# Patient Record
Sex: Male | Born: 1955 | Race: White | Hispanic: No | Marital: Married | Smoking: Never smoker
Health system: Southern US, Community
[De-identification: ages and names within clinical notes are randomized; demographics above are authoritative.]

## PROBLEM LIST (undated history)

## (undated) DIAGNOSIS — I10 Essential (primary) hypertension: Secondary | ICD-10-CM

## (undated) HISTORY — PX: SHOULDER SURGERY: SHX246

---

## 1998-06-24 ENCOUNTER — Emergency Department (HOSPITAL_COMMUNITY): Admission: EM | Admit: 1998-06-24 | Discharge: 1998-06-24 | Payer: Self-pay | Admitting: Emergency Medicine

## 2004-01-28 ENCOUNTER — Ambulatory Visit (HOSPITAL_COMMUNITY): Admission: RE | Admit: 2004-01-28 | Discharge: 2004-01-28 | Payer: Self-pay | Admitting: *Deleted

## 2012-02-29 ENCOUNTER — Other Ambulatory Visit: Payer: Self-pay | Admitting: Family Medicine

## 2012-02-29 DIAGNOSIS — B1710 Acute hepatitis C without hepatic coma: Secondary | ICD-10-CM

## 2012-03-14 ENCOUNTER — Ambulatory Visit
Admission: RE | Admit: 2012-03-14 | Discharge: 2012-03-14 | Disposition: A | Discharge status: Home / Self Care | Payer: BC Managed Care – PPO | Source: Ambulatory Visit | Attending: Family Medicine | Admitting: Family Medicine

## 2012-03-14 DIAGNOSIS — B1710 Acute hepatitis C without hepatic coma: Secondary | ICD-10-CM

## 2013-01-22 ENCOUNTER — Ambulatory Visit
Admission: RE | Admit: 2013-01-22 | Discharge: 2013-01-22 | Disposition: A | Discharge status: Home / Self Care | Payer: BC Managed Care – PPO | Source: Ambulatory Visit | Attending: Physician Assistant | Admitting: Physician Assistant

## 2013-01-22 ENCOUNTER — Other Ambulatory Visit: Payer: Self-pay | Admitting: Physician Assistant

## 2013-01-22 DIAGNOSIS — M25511 Pain in right shoulder: Secondary | ICD-10-CM

## 2013-01-22 DIAGNOSIS — IMO0001 Reserved for inherently not codable concepts without codable children: Secondary | ICD-10-CM

## 2013-02-03 ENCOUNTER — Other Ambulatory Visit: Payer: Self-pay | Admitting: Orthopedic Surgery

## 2013-02-03 DIAGNOSIS — M25511 Pain in right shoulder: Secondary | ICD-10-CM

## 2013-02-06 ENCOUNTER — Other Ambulatory Visit: Payer: BC Managed Care – PPO

## 2013-02-09 ENCOUNTER — Ambulatory Visit
Admission: RE | Admit: 2013-02-09 | Discharge: 2013-02-09 | Disposition: A | Discharge status: Home / Self Care | Payer: BC Managed Care – PPO | Source: Ambulatory Visit | Attending: Orthopedic Surgery | Admitting: Orthopedic Surgery

## 2013-02-09 DIAGNOSIS — M25511 Pain in right shoulder: Secondary | ICD-10-CM

## 2014-11-04 ENCOUNTER — Inpatient Hospital Stay (HOSPITAL_COMMUNITY)
Admission: EM | Admit: 2014-11-04 | Discharge: 2014-11-07 | DRG: 440 | Disposition: A | Discharge status: Home / Self Care | Payer: BC Managed Care – PPO | Attending: Internal Medicine | Admitting: Internal Medicine

## 2014-11-04 ENCOUNTER — Emergency Department (HOSPITAL_COMMUNITY): Payer: BC Managed Care – PPO

## 2014-11-04 ENCOUNTER — Encounter (HOSPITAL_COMMUNITY): Payer: Self-pay | Admitting: Emergency Medicine

## 2014-11-04 DIAGNOSIS — Z79899 Other long term (current) drug therapy: Secondary | ICD-10-CM | POA: Diagnosis not present

## 2014-11-04 DIAGNOSIS — K859 Acute pancreatitis without necrosis or infection, unspecified: Secondary | ICD-10-CM | POA: Diagnosis present

## 2014-11-04 DIAGNOSIS — F419 Anxiety disorder, unspecified: Secondary | ICD-10-CM | POA: Diagnosis present

## 2014-11-04 DIAGNOSIS — B1920 Unspecified viral hepatitis C without hepatic coma: Secondary | ICD-10-CM | POA: Diagnosis present

## 2014-11-04 DIAGNOSIS — R1013 Epigastric pain: Secondary | ICD-10-CM | POA: Diagnosis present

## 2014-11-04 DIAGNOSIS — Z791 Long term (current) use of non-steroidal anti-inflammatories (NSAID): Secondary | ICD-10-CM | POA: Diagnosis not present

## 2014-11-04 DIAGNOSIS — I10 Essential (primary) hypertension: Secondary | ICD-10-CM | POA: Diagnosis present

## 2014-11-04 DIAGNOSIS — R000 Tachycardia, unspecified: Secondary | ICD-10-CM | POA: Diagnosis present

## 2014-11-04 DIAGNOSIS — D72829 Elevated white blood cell count, unspecified: Secondary | ICD-10-CM | POA: Diagnosis present

## 2014-11-04 DIAGNOSIS — R509 Fever, unspecified: Secondary | ICD-10-CM | POA: Diagnosis not present

## 2014-11-04 DIAGNOSIS — Z8249 Family history of ischemic heart disease and other diseases of the circulatory system: Secondary | ICD-10-CM

## 2014-11-04 DIAGNOSIS — F1210 Cannabis abuse, uncomplicated: Secondary | ICD-10-CM | POA: Diagnosis present

## 2014-11-04 DIAGNOSIS — K8500 Idiopathic acute pancreatitis without necrosis or infection: Secondary | ICD-10-CM

## 2014-11-04 HISTORY — DX: Essential (primary) hypertension: I10

## 2014-11-04 LAB — COMPREHENSIVE METABOLIC PANEL
ALBUMIN: 3.6 g/dL (ref 3.5–5.2)
ALK PHOS: 79 U/L (ref 39–117)
ALT: 48 U/L (ref 0–53)
ANION GAP: 13 (ref 5–15)
AST: 33 U/L (ref 0–37)
BUN: 17 mg/dL (ref 6–23)
CO2: 22 mEq/L (ref 19–32)
Calcium: 8.8 mg/dL (ref 8.4–10.5)
Chloride: 101 mEq/L (ref 96–112)
Creatinine, Ser: 0.92 mg/dL (ref 0.50–1.35)
GFR calc Af Amer: >90 mL/min (ref >90)
GFR calc non Af Amer: >90 mL/min (ref >90)
Glucose, Bld: 133 mg/dL — ABNORMAL HIGH (ref 70–99)
Potassium: 3.8 mEq/L (ref 3.7–5.3)
Sodium: 136 mEq/L — ABNORMAL LOW (ref 137–147)
TOTAL PROTEIN: 7 g/dL (ref 6.0–8.3)
Total Bilirubin: 1 mg/dL (ref 0.3–1.2)

## 2014-11-04 LAB — CBC WITH DIFFERENTIAL/PLATELET
Basophils Absolute: 0 10*3/uL (ref 0.0–0.1)
Basophils Relative: 0 % (ref 0–1)
Eosinophils Absolute: 0 10*3/uL (ref 0.0–0.7)
Eosinophils Relative: 0 % (ref 0–5)
HCT: 44.9 % (ref 39.0–52.0)
Hemoglobin: 15.4 g/dL (ref 13.0–17.0)
LYMPHS ABS: 1.8 10*3/uL (ref 0.7–4.0)
LYMPHS PCT: 11 % — AB (ref 12–46)
MCH: 31.2 pg (ref 26.0–34.0)
MCHC: 34.3 g/dL (ref 30.0–36.0)
MCV: 90.9 fL (ref 78.0–100.0)
MONOS PCT: 10 % (ref 3–12)
Monocytes Absolute: 1.6 10*3/uL — ABNORMAL HIGH (ref 0.1–1.0)
Neutro Abs: 13 10*3/uL — ABNORMAL HIGH (ref 1.7–7.7)
Neutrophils Relative %: 79 % — ABNORMAL HIGH (ref 43–77)
Platelets: 164 10*3/uL (ref 150–400)
RBC: 4.94 MIL/uL (ref 4.22–5.81)
RDW: 13.3 % (ref 11.5–15.5)
WBC: 16.5 10*3/uL — AB (ref 4.0–10.5)

## 2014-11-04 LAB — ETHANOL: Alcohol, Ethyl (B): <11 mg/dL (ref 0–11)

## 2014-11-04 LAB — TRIGLYCERIDES: Triglycerides: 54 mg/dL (ref <150)

## 2014-11-04 LAB — RAPID URINE DRUG SCREEN, HOSP PERFORMED
AMPHETAMINES: NOT DETECTED
BARBITURATES: NOT DETECTED
BENZODIAZEPINES: NOT DETECTED
Cocaine: NOT DETECTED
Opiates: POSITIVE — AB
TETRAHYDROCANNABINOL: POSITIVE — AB

## 2014-11-04 LAB — TROPONIN I: Troponin I: <0.3 ng/mL (ref <0.30)

## 2014-11-04 LAB — LIPASE, BLOOD: Lipase: 509 U/L — ABNORMAL HIGH (ref 11–59)

## 2014-11-04 MED ORDER — ONDANSETRON HCL 4 MG PO TABS: 4.0000 mg | ORAL_TABLET | Freq: Four times a day (QID) | ORAL | Status: DC | PRN

## 2014-11-04 MED ORDER — LORAZEPAM 2 MG/ML IJ SOLN
0.5000 mg | Freq: Four times a day (QID) | INTRAMUSCULAR | Status: DC | PRN
  Administered 2014-11-04 – 2014-11-07 (×6): 0.5 mg via INTRAVENOUS
  Filled 2014-11-04 (×6): qty 1

## 2014-11-04 MED ORDER — CYCLOBENZAPRINE HCL 10 MG PO TABS
5.0000 mg | ORAL_TABLET | Freq: Two times a day (BID) | ORAL | Status: DC | PRN
  Administered 2014-11-05 (×2): 5 mg via ORAL
  Filled 2014-11-04 (×2): qty 1

## 2014-11-04 MED ORDER — ACETAMINOPHEN 325 MG PO TABS: 650.0000 mg | ORAL_TABLET | Freq: Four times a day (QID) | ORAL | Status: DC | PRN

## 2014-11-04 MED ORDER — ONDANSETRON HCL 4 MG/2ML IJ SOLN
4.0000 mg | Freq: Once | INTRAMUSCULAR | Status: AC
  Administered 2014-11-04: 4 mg via INTRAVENOUS
  Filled 2014-11-04: qty 2

## 2014-11-04 MED ORDER — BENAZEPRIL HCL 20 MG PO TABS
20.0000 mg | ORAL_TABLET | Freq: Every day | ORAL | Status: DC
  Administered 2014-11-05 – 2014-11-07 (×3): 20 mg via ORAL
  Filled 2014-11-04 (×4): qty 1

## 2014-11-04 MED ORDER — SODIUM CHLORIDE 0.9 % IV SOLN
INTRAVENOUS | Status: DC
  Administered 2014-11-04 – 2014-11-05 (×2): via INTRAVENOUS

## 2014-11-04 MED ORDER — ACETAMINOPHEN 650 MG RE SUPP: 650.0000 mg | Freq: Four times a day (QID) | RECTAL | Status: DC | PRN

## 2014-11-04 MED ORDER — ONDANSETRON HCL 4 MG/2ML IJ SOLN: 4.0000 mg | Freq: Four times a day (QID) | INTRAMUSCULAR | Status: DC | PRN

## 2014-11-04 MED ORDER — MORPHINE SULFATE 2 MG/ML IJ SOLN
1.0000 mg | INTRAMUSCULAR | Status: DC | PRN
  Administered 2014-11-04 – 2014-11-05 (×7): 1 mg via INTRAVENOUS
  Filled 2014-11-04 (×7): qty 1

## 2014-11-04 MED ORDER — SODIUM CHLORIDE 0.9 % IV BOLUS (SEPSIS)
1000.0000 mL | Freq: Once | INTRAVENOUS | Status: AC
  Administered 2014-11-04: 1000 mL via INTRAVENOUS

## 2014-11-04 MED ORDER — HYDROCODONE-ACETAMINOPHEN 5-325 MG PO TABS
1.0000 | ORAL_TABLET | Freq: Once | ORAL | Status: AC
  Administered 2014-11-04: 1 via ORAL
  Filled 2014-11-04: qty 1

## 2014-11-04 MED ORDER — AMLODIPINE BESYLATE 10 MG PO TABS
10.0000 mg | ORAL_TABLET | Freq: Every day | ORAL | Status: DC
  Administered 2014-11-05 – 2014-11-07 (×3): 10 mg via ORAL
  Filled 2014-11-04 (×4): qty 1

## 2014-11-04 MED ORDER — AMLODIPINE BESY-BENAZEPRIL HCL 10-20 MG PO CAPS: 1.0000 | ORAL_CAPSULE | Freq: Every day | ORAL | Status: DC

## 2014-11-04 MED ORDER — ADULT MULTIVITAMIN W/MINERALS CH
1.0000 | ORAL_TABLET | Freq: Every day | ORAL | Status: DC
  Administered 2014-11-05: 1 via ORAL
  Filled 2014-11-04 (×3): qty 1

## 2014-11-04 NOTE — ED Notes (Signed)
Pt reports constant abdominal pain for x 3 days with nausea and vomiting denies diarrhea. Last BM last Pm. Feels weak. Pt ambulatory. NAD

## 2014-11-04 NOTE — Progress Notes (Signed)
Utilization Review completed.  Alaija Ruble RN CM  

## 2014-11-04 NOTE — ED Notes (Signed)
Patient transported to CT 

## 2014-11-04 NOTE — H&P (Signed)
[facility] History and Physical  Almyra FreeMalcolm W Samara RUE:[id number]:[id number] DOB: [birth year] DOA: 11/04/2014  Referring physician: ER physician PCP: No primary care provider on file.   Chief Complaint: abdominal pain  HPI:  58 -year-old male with past medical history significant for hepatitis C but not on treatment, hypertension, anxiety who [facility]04/2014 with worsening abdominal pain in epigastric and mid abdominal area for past 2 days prior to this admission. Patient reported pain to be 7 out of 10 in intensity, radiating to the back, constant, not relieved with home analgesia. Patient reported pain got better in ED.Patient reported associated nausea and about 10 episodes of nonbloody vomiting. No reports of diarrhea or constipation. No reports of fever. No reports of blood in the stool. No other complaints such as chest pain, shortness of breath or palpitations. No lightheadedness or loss of consciousness. In ED, patient was hemodynamically stable. Blood work was significant for lipase level of 509 in addition to evidence of pancreatitis on CT abdomen pelvis without contrast. Patient was admitted for further management of acute pancreatitis.  Assessment & Plan    Principal Problem:   Acute pancreatitis - abdominal pain likely secondary to acute pancreatitis as evidenced on CT abdomen and pelvis without contrast. In addition, lipase level is elevated at 509. - Liver function enzymes are all within normal limits. - Obtain alcohol and urine drug screen - Continue supportive care with IV fluids, analgesia as needed, antiemetics as needed - Keep nothing by mouth for now. Active Problems:   Leukocytosis - likely reflective of acute pancreatitis, inflammatory. No evidence of acute infection.   HTN (hypertension) - resume home medication Lotrel - renal function is within normal limits   Anxiety - since patient is nothing by mouth instead of Klonopin will give Ativan Q 8 hours  PRN  DVT prophylaxis:   SCD's bilaterally   Radiological Exams on Admission: Ct Abdomen Pelvis Wo Contrast 11/04/2014   1. Pancreatitis without loculated collection. Recommend confirmation with serum pancreatic enzymes. 2. Bibasilar atelectasis with trace left pleural effusion. 3. Incidental findings include left nephrolithiasis, left adrenal adenoma, and colonic diverticulosis.   Code Status: Full Family Communication: Plan of care discussed with the patient  Disposition Plan: Admit for further evaluation  Manson PasseyEVINE, Shanaiya Bene, MD  [facility] Pager [phone]  Review of Systems:  Constitutional: Negative for fever, chills and malaise/fatigue. Negative for diaphoresis.  HENT: Negative for hearing loss, ear pain, nosebleeds, congestion, sore throat, neck pain, tinnitus and ear discharge.   Eyes: Negative for blurred vision, double vision, photophobia, pain, discharge and redness.  Respiratory: Negative for cough, hemoptysis, sputum production, shortness of breath, wheezing and stridor.   Cardiovascular: Negative for chest pain, palpitations, orthopnea, claudication and leg swelling.  Gastrointestinal: per history of present illness  Genitourinary: Negative for dysuria, urgency, frequency, hematuria and flank pain.  Musculoskeletal: Negative for myalgias, back pain, joint pain and falls.  Skin: Negative for itching and rash.  Neurological: Negative for dizziness and weakness. Negative for tingling, tremors, sensory change, speech change, focal weakness, loss of consciousness and headaches.  Endo/Heme/Allergies: Negative for environmental allergies and polydipsia. Does not bruise/bleed easily.  Psychiatric/Behavioral: Negative for suicidal ideas. The patient is not nervous/anxious.      Past Medical History  Diagnosis Date  . Hypertension    Past Surgical History  Procedure Laterality Date  . Shoulder surgery     Social History:  reports that he has never smoked. He does not have  any smokeless tobacco history  on file. He reports that he drinks alcohol. His drug history is not on file.  No Known Allergies  Family History: hypertension and family   Prior to Admission medications   Medication Sig Start Date End Date Taking? Authorizing Provider  amLODipine-benazepril (LOTREL) 10-20 MG per capsule Take 1 capsule by mouth daily.   Yes Historical Provider, MD  clonazePAM (KLONOPIN) 1 MG tablet Take 0.5 mg by mouth daily as needed for anxiety.   Yes Historical Provider, MD  cyclobenzaprine (FLEXERIL) 10 MG tablet Take 10 mg by mouth 2 (two) times daily as needed for muscle spasms.   Yes Historical Provider, MD  Multiple Vitamin (MULTIVITAMIN WITH MINERALS) TABS tablet Take 1 tablet by mouth daily.   Yes Historical Provider, MD  naproxen sodium (ANAPROX) 220 MG tablet Take 220 mg by mouth daily as needed (for pain).   Yes Historical Provider, MD  cyclobenzaprine (FLEXERIL) 5 MG tablet Take 5 mg by mouth 2 (two) times daily as needed for muscle spasms.    Historical Provider, MD   Physical Exam: Filed Vitals:   11/04/14 0816 11/04/14 1110  BP:  124/64  Pulse: 118 100  Temp: 98.1 F (36.7 C) 98.5 F (36.9 C)  TempSrc: Oral Oral  Resp: 18 20  SpO2: 95% 92%    Physical Exam  Constitutional: Appears well-developed and well-nourished. No distress.  HENT: Normocephalic. No tonsillar erythema or exudates Eyes: Conjunctivae and EOM are normal. PERRLA, no scleral icterus.  Neck: Normal ROM. Neck supple. No JVD. No tracheal deviation. No thyromegaly.  CVS: RRR, S1/S2 +, no murmurs, no gallops, no carotid bruit.  Pulmonary: Effort and breath sounds normal, no stridor, rhonchi, wheezes, rales.  Abdominal: Soft. BS +,  Obese, distended, tender in mid abdomen, no rebound or guarding.  Musculoskeletal: Normal range of motion. No edema and no tenderness.  Lymphadenopathy: No lymphadenopathy noted, cervical, inguinal. Neuro: Alert. Normal reflexes, muscle tone coordination. No  focal neurologic deficits. Skin: Skin is warm and dry. No rash noted. Not diaphoretic. No erythema. No pallor.  Psychiatric: Normal mood and affect. Behavior, judgment, thought content normal.   Labs on Admission:  Basic Metabolic Panel:  Recent Labs Lab 11/04/14 0903  NA 136*  K 3.8  CL 101  CO2 22  GLUCOSE 133*  BUN 17  CREATININE 0.92  CALCIUM 8.8   Liver Function Tests:  Recent Labs Lab 11/04/14 0903  AST 33  ALT 48  ALKPHOS 79  BILITOT 1.0  PROT 7.0  ALBUMIN 3.6    Recent Labs Lab 11/04/14 0903  LIPASE 509*   No results for input(s): AMMONIA in the last 168 hours. CBC:  Recent Labs Lab 11/04/14 0903  WBC 16.5*  NEUTROABS 13.0*  HGB 15.4  HCT 44.9  MCV 90.9  PLT 164   Cardiac Enzymes:  Recent Labs Lab 11/04/14 0903  TROPONINI <0.30   BNP: Invalid input(s): POCBNP CBG: No results for input(s): GLUCAP in the last 168 hours.  If 7PM-7AM, please contact night-coverage [email or web address] Password [email or web address] 11/04/2014, 11:52 AM

## 2014-11-04 NOTE — ED Provider Notes (Signed)
CSN: [id number]     Arrival date & time 11/04/14  9629 History   First MD Initiated Contact with Patient 11/04/14 [phone]     Chief Complaint  Patient presents with  . Abdominal Pain  . Nausea    HPI Mr. Leroy Potter is a 58yo man w/ PMHx of Hepatitis C not on treatment and HTN who presents to the ED with abdominal pain since Tuesday night. Patient reports Tuesday night he went out to dinner with his son and had a large meal consisting of BBQ ribs and other fatty foods. He reports when he got home he was feeling full and then had a wave of nausea and vomited 7-10 times. He denies any blood in the vomit. He reports his abdominal pain started that night as well. He describes his abdominal pain as located mostly in the epigastric and radiating down to his navel on both sides and his left back, 7/10 in severity, constant, sharp when moving and dull when at rest, and nothing has made it better. He denies any additional episodes of vomiting, but reports subjective fever/chills, nausea, and decreased appetite. He denies diarrhea, constipation, melena, and hematochezia. He denies any previous abdominal surgeries and hx of gallstones. He denies frequent alcohol use, stating he only had 2 beers Tuesday night and no additional alcohol. He denies history of high cholesterol.   Past Medical History  Diagnosis Date  . Hypertension    Past Surgical History  Procedure Laterality Date  . Shoulder surgery     No family history on file. History  Substance Use Topics  . Smoking status: Never Smoker   . Smokeless tobacco: Not on file  . Alcohol Use: Yes    Review of Systems General: Denies night sweats, changes in weight HEENT: Denies headaches, ear pain, changes in vision, rhinorrhea, sore throat CV: Denies CP, palpitations, SOB, orthopnea Pulm: Denies SOB, cough, wheezing GI: See HPI GU: Denies dysuria, hematuria, frequency Msk: Denies muscle cramps, joint pains Neuro: Denies weakness, numbness,  tingling Skin: Denies rashes, bruising   Allergies  Review of patient's allergies indicates no known allergies.  Home Medications   Prior to Admission medications   Medication Sig Start Date End Date Taking? Authorizing Provider  amLODipine-benazepril (LOTREL) 10-20 MG per capsule Take 1 capsule by mouth daily.   Yes Historical Provider, MD  clonazePAM (KLONOPIN) 1 MG tablet Take 0.5 mg by mouth daily as needed for anxiety.   Yes Historical Provider, MD  cyclobenzaprine (FLEXERIL) 10 MG tablet Take 10 mg by mouth 2 (two) times daily as needed for muscle spasms.   Yes Historical Provider, MD  Multiple Vitamin (MULTIVITAMIN WITH MINERALS) TABS tablet Take 1 tablet by mouth daily.   Yes Historical Provider, MD  naproxen sodium (ANAPROX) 220 MG tablet Take 220 mg by mouth daily as needed (for pain).   Yes Historical Provider, MD  cyclobenzaprine (FLEXERIL) 5 MG tablet Take 5 mg by mouth 2 (two) times daily as needed for muscle spasms.    Historical Provider, MD   Pulse 118  Temp(Src) 98.1 F (36.7 C) (Oral)  Resp 18  SpO2 95% Physical Exam General: alert, appears uncomfortable, sitting up in bed HEENT: [place]/AT, EOMI, sclera anicteric, mucus membranes dry CV: tachycardic, normal S1/S2, no m/g/r Pulm: CTA bilaterally, breaths non-labored Abd: BS+, soft, very tender to palpation in epigastrium, RUQ, and LUQ Ext: warm, no edema, moves all Neuro: alert and oriented x 3, CNs II-XII intact, strength 5/5 in upper and lower extremities  ED Course  Procedures (including critical care time) Labs Review Labs Reviewed  COMPREHENSIVE METABOLIC PANEL - Abnormal; Notable for the following:    Sodium 136 (*)    Glucose, Bld 133 (*)    All other components within normal limits  LIPASE, BLOOD - Abnormal; Notable for the following:    Lipase 509 (*)    All other components within normal limits  CBC WITH DIFFERENTIAL - Abnormal; Notable for the following:    WBC 16.5 (*)    Neutrophils Relative %  79 (*)    Neutro Abs 13.0 (*)    Lymphocytes Relative 11 (*)    Monocytes Absolute 1.6 (*)    All other components within normal limits  TROPONIN I  TRIGLYCERIDES    Imaging Review Ct Abdomen Pelvis Wo Contrast  11/04/2014   CLINICAL DATA:  Abdominal pain and nausea  EXAM: CT ABDOMEN AND PELVIS WITHOUT CONTRAST  TECHNIQUE: Multidetector CT imaging of the abdomen and pelvis was performed following the standard protocol without IV contrast.  COMPARISON:  None.  FINDINGS: BODY WALL: Unremarkable.  LOWER CHEST: Bandlike opacities in the lower lungs consistent with atelectasis. Trace left pleural effusion.  ABDOMEN/PELVIS:  Liver: Presumed 1 cm cyst in the upper central liver.  Biliary: No evidence of biliary obstruction or stone.  Pancreas: There is fat infiltration in the left upper quadrant, centered around the pancreas. The pancreatic parenchyma does not appear particularly expanded, but no other primary source of inflammation is identified. There is peritoneal fluid along the left paracolic gutter, without evidence of loculation. There is a sub cm area of relative low attenuation in the pancreatic body (image 29) not definitive for mass or cyst (volume averaging of fat could also give this appearance).  Spleen: Unremarkable.  Adrenals: 2 cm mass within the left adrenal gland, with density consistent with adenoma.  Kidneys and ureters: 4 mm stone in the lower pole left kidney, nonobstructive. No ureteral calculus or hydronephrosis.  Bladder: Unremarkable.  Reproductive: Unremarkable for age.  Bowel: No bowel obstruction or primary inflammation. Distal colonic diverticulosis. Remote appearing epiploic appendagitis along the sigmoid colon. Negative appendix  Retroperitoneum: Left upper quadrant retroperitoneal edema, as above.  Peritoneum: Small volume reactive ascites, accumulating in the left paracolic gutter and pelvis.  Vascular: No acute abnormality.  OSSEOUS: Bilateral pars defects at L5 with grade 1  anterolisthesis and focally advanced L5-S1 degenerative disc disease. Slip and endplate spurs cause advanced bilateral foraminal stenosis.  IMPRESSION: 1. Pancreatitis without loculated collection. Recommend confirmation with serum pancreatic enzymes. 2. Bibasilar atelectasis with trace left pleural effusion. 3. Incidental findings include left nephrolithiasis, left adrenal adenoma, and colonic diverticulosis.   Electronically Signed   By: Tiburcio PeaJonathan  Watts M.D.   On: 11/04/2014 09:30     EKG Interpretation   Date/Time:  Thursday November 04 2014 08:14:10 EST Ventricular Rate:  119 PR Interval:  170 QRS Duration: 84 QT Interval:  311 QTC Calculation: 437 R Axis:   41 Text Interpretation:  Sinus tachycardia Probable left atrial enlargement  Minimal ST elevation, inferior leads Baseline wander in lead(s) II III aVF  V3 V4 V5 V6 Abnormal ekg Confirmed by BEATON  MD, ROBERT ([id number]) on  11/04/2014 8:37:17 AM      MDM   Final diagnoses:  Idiopathic acute pancreatitis   Patient presents with abdominal pain for 2 days associated with nausea, vomiting, chills after a large meal. Patient is extremely tender to palpation in the epigastrium as well as RUQ and LUQ. Likely cholecystitis vs. pancreatitis. Will  check CBC, CMP, lipase, triglycerides, troponin, and CT Abd/Pelvis.   CT Abd/Pelvis shows evidence of pancreatitis, confirmed with lipase of 509. WBC count 16.5. Will admit to hospitalist service.   Rich Numberarly Grier Czerwinski, MD 11/04/14 1020  Nelia Shiobert L Beaton, MD 11/10/14 [phone]

## 2014-11-04 NOTE — Plan of Care (Signed)
Problem: Consults Goal: Pancreatitis Patient Education See Patient Education Module for education specifics. Outcome: Completed/Met Date Met:  11/04/14 Goal: Skin Care Protocol Initiated - if Braden Score 18 or less If consults are not indicated, leave blank or document N/A Outcome: Not Applicable Date Met:  57/32/20  Problem: Phase I Progression Outcomes Goal: Pain controlled with appropriate interventions Outcome: Completed/Met Date Met:  11/04/14 Goal: OOB as tolerated unless otherwise ordered Outcome: Completed/Met Date Met:  11/04/14 Goal: Initial discharge plan identified Outcome: Completed/Met Date Met:  11/04/14 Goal: Voiding-avoid urinary catheter unless indicated Outcome: Completed/Met Date Met:  11/04/14 Goal: Hemodynamically stable Outcome: Completed/Met Date Met:  11/04/14 Goal: Nausea/vomiting controlled after medication Outcome: Completed/Met Date Met:  11/04/14 Goal: Other Phase I Outcomes/Goals Outcome: Not Applicable Date Met:  25/42/70

## 2014-11-04 NOTE — Progress Notes (Signed)
Received report from [place], RN, stating that patient with stable vital signs, pain under control, being admitted for pancreatitis. Patient will be admitted to 1334.

## 2014-11-05 DIAGNOSIS — K858 Other acute pancreatitis: Secondary | ICD-10-CM

## 2014-11-05 LAB — COMPREHENSIVE METABOLIC PANEL
ALBUMIN: 2.9 g/dL — AB (ref 3.5–5.2)
ALT: 32 U/L (ref 0–53)
ANION GAP: 16 — AB (ref 5–15)
AST: 22 U/L (ref 0–37)
Alkaline Phosphatase: 70 U/L (ref 39–117)
BUN: 20 mg/dL (ref 6–23)
CALCIUM: 8.4 mg/dL (ref 8.4–10.5)
CHLORIDE: 104 meq/L (ref 96–112)
CO2: 18 mEq/L — ABNORMAL LOW (ref 19–32)
CREATININE: 0.97 mg/dL (ref 0.50–1.35)
GFR calc Af Amer: >90 mL/min (ref >90)
GFR calc non Af Amer: 90 mL/min — ABNORMAL LOW (ref >90)
Glucose, Bld: 90 mg/dL (ref 70–99)
Potassium: 4.2 mEq/L (ref 3.7–5.3)
Sodium: 138 mEq/L (ref 137–147)
Total Bilirubin: 1.1 mg/dL (ref 0.3–1.2)
Total Protein: 6.4 g/dL (ref 6.0–8.3)

## 2014-11-05 LAB — CBC
HEMATOCRIT: 45.1 % (ref 39.0–52.0)
HEMOGLOBIN: 14.9 g/dL (ref 13.0–17.0)
MCH: 31.1 pg (ref 26.0–34.0)
MCHC: 33 g/dL (ref 30.0–36.0)
MCV: 94.2 fL (ref 78.0–100.0)
Platelets: 148 10*3/uL — ABNORMAL LOW (ref 150–400)
RBC: 4.79 MIL/uL (ref 4.22–5.81)
RDW: 13.5 % (ref 11.5–15.5)
WBC: 17.4 10*3/uL — AB (ref 4.0–10.5)

## 2014-11-05 LAB — GLUCOSE, CAPILLARY: Glucose-Capillary: 91 mg/dL (ref 70–99)

## 2014-11-05 MED ORDER — ASPIRIN 325 MG PO TABS
325.0000 mg | ORAL_TABLET | Freq: Once | ORAL | Status: AC
  Administered 2014-11-05: 325 mg via ORAL
  Filled 2014-11-05: qty 1

## 2014-11-05 MED ORDER — SODIUM CHLORIDE 0.9 % IV SOLN
INTRAVENOUS | Status: DC
Start: 2014-11-05 — End: 2014-11-05

## 2014-11-05 MED ORDER — SODIUM CHLORIDE 0.9 % IV SOLN
INTRAVENOUS | Status: DC
  Administered 2014-11-05: 21:00:00 via INTRAVENOUS
  Administered 2014-11-05: 1000 mL via INTRAVENOUS

## 2014-11-05 MED ORDER — SODIUM CHLORIDE 0.9 % IV SOLN
INTRAVENOUS | Status: DC
  Administered 2014-11-05 – 2014-11-07 (×7): via INTRAVENOUS

## 2014-11-05 MED ORDER — SODIUM CHLORIDE 0.9 % IV SOLN: INTRAVENOUS | Status: DC

## 2014-11-05 MED ORDER — MORPHINE SULFATE 2 MG/ML IJ SOLN
2.0000 mg | INTRAMUSCULAR | Status: DC | PRN
Start: 2014-11-05 — End: 2014-11-06
  Administered 2014-11-05 – 2014-11-06 (×3): 2 mg via INTRAVENOUS
  Filled 2014-11-05 (×3): qty 1

## 2014-11-05 NOTE — Consult Note (Signed)
Reason for Consult: Acute Pancreatitis Referring Physician: [facility]  Almyra Free HPI: This is a 58 year old male with a PMH of HTN, HCV, and anxiety who is admitted for acute pancreatitis.  He started to have issues with epigastric abdominal pain with radiation to his back after he ate a fatty meal.  He suffered with the pain for two days before his admission.  With his abdominal pain he had nausea and vomiting.  Over the interval time period his pain has worsened and he has spiked a fever.  The initial CT scan was performed without contrast, but it was significant for pancreatitis.  No evidence of biliary ductal dilation.  No reports of any heavy ETOH use, but his Utox is positive for THC.  There is no family history of gallstones, gallbladder disease, or pancreatitis.  As for his HCV, he was diagnosed in the past, but never treated.  He states that his liver enzymes were followed as an outpatient with his PCP.   Past Medical History  Diagnosis Date  . Hypertension     Past Surgical History  Procedure Laterality Date  . Shoulder surgery      History reviewed. No pertinent family history.  Social History:  reports that he has never smoked. He does not have any smokeless tobacco history on file. He reports that he drinks alcohol. His drug history is not on file.  Allergies: No Known Allergies  Medications:  Scheduled: . amLODipine  10 mg Oral Daily   And  . benazepril  20 mg Oral Daily  . multivitamin with minerals  1 tablet Oral Daily   Continuous: . sodium chloride 75 mL/hr at 11/05/14 0018    Results for orders placed or performed during the hospital encounter of 11/04/14 (from the past 24 hour(s))  Ethanol     Status: None   Collection Time: 11/04/14 12:00 PM  Result Value Ref Range   Alcohol, Ethyl (B) <11 0 - 11 mg/dL  Urine rapid drug screen (hosp performed)     Status: Abnormal   Collection Time: 11/04/14  4:10 PM  Result Value Ref Range   Opiates  POSITIVE (A) NONE DETECTED   Cocaine NONE DETECTED NONE DETECTED   Benzodiazepines NONE DETECTED NONE DETECTED   Amphetamines NONE DETECTED NONE DETECTED   Tetrahydrocannabinol POSITIVE (A) NONE DETECTED   Barbiturates NONE DETECTED NONE DETECTED  Comprehensive metabolic panel     Status: Abnormal   Collection Time: 11/05/14  5:00 AM  Result Value Ref Range   Sodium 138 137 - 147 mEq/L   Potassium 4.2 3.7 - 5.3 mEq/L   Chloride 104 96 - 112 mEq/L   CO2 18 (L) 19 - 32 mEq/L   Glucose, Bld 90 70 - 99 mg/dL   BUN 20 6 - 23 mg/dL   Creatinine, Ser 1.61 0.50 - 1.35 mg/dL   Calcium 8.4 8.4 - 09.6 mg/dL   Total Protein 6.4 6.0 - 8.3 g/dL   Albumin 2.9 (L) 3.5 - 5.2 g/dL   AST 22 0 - 37 U/L   ALT 32 0 - 53 U/L   Alkaline Phosphatase 70 39 - 117 U/L   Total Bilirubin 1.1 0.3 - 1.2 mg/dL   GFR calc non Af Amer 90 (L) >90 mL/min   GFR calc Af Amer >90 >90 mL/min   Anion gap 16 (H) 5 - 15  CBC     Status: Abnormal   Collection Time: 11/05/14  5:00 AM  Result Value Ref  Range   WBC 17.4 (H) 4.0 - 10.5 K/uL   RBC 4.79 4.22 - 5.81 MIL/uL   Hemoglobin 14.9 13.0 - 17.0 g/dL   HCT 16.145.1 09.639.0 - 04.552.0 %   MCV 94.2 78.0 - 100.0 fL   MCH 31.1 26.0 - 34.0 pg   MCHC 33.0 30.0 - 36.0 g/dL   RDW 40.913.5 81.111.5 - 91.415.5 %   Platelets 148 (L) 150 - 400 K/uL  Glucose, capillary     Status: None   Collection Time: 11/05/14  7:37 AM  Result Value Ref Range   Glucose-Capillary 91 70 - 99 mg/dL     Ct Abdomen Pelvis Wo Contrast  11/04/2014   CLINICAL DATA:  Abdominal pain and nausea  EXAM: CT ABDOMEN AND PELVIS WITHOUT CONTRAST  TECHNIQUE: Multidetector CT imaging of the abdomen and pelvis was performed following the standard protocol without IV contrast.  COMPARISON:  None.  FINDINGS: BODY WALL: Unremarkable.  LOWER CHEST: Bandlike opacities in the lower lungs consistent with atelectasis. Trace left pleural effusion.  ABDOMEN/PELVIS:  Liver: Presumed 1 cm cyst in the upper central liver.  Biliary: No evidence of  biliary obstruction or stone.  Pancreas: There is fat infiltration in the left upper quadrant, centered around the pancreas. The pancreatic parenchyma does not appear particularly expanded, but no other primary source of inflammation is identified. There is peritoneal fluid along the left paracolic gutter, without evidence of loculation. There is a sub cm area of relative low attenuation in the pancreatic body (image 29) not definitive for mass or cyst (volume averaging of fat could also give this appearance).  Spleen: Unremarkable.  Adrenals: 2 cm mass within the left adrenal gland, with density consistent with adenoma.  Kidneys and ureters: 4 mm stone in the lower pole left kidney, nonobstructive. No ureteral calculus or hydronephrosis.  Bladder: Unremarkable.  Reproductive: Unremarkable for age.  Bowel: No bowel obstruction or primary inflammation. Distal colonic diverticulosis. Remote appearing epiploic appendagitis along the sigmoid colon. Negative appendix  Retroperitoneum: Left upper quadrant retroperitoneal edema, as above.  Peritoneum: Small volume reactive ascites, accumulating in the left paracolic gutter and pelvis.  Vascular: No acute abnormality.  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 Slip and endplate spurs cause advanced bilateral foraminal stenosis.  IMPRESSION: 1. Pancreatitis without loculated collection. Recommend confirmation with serum pancreatic enzymes. 2. Bibasilar atelectasis with trace left pleural effusion. 3. Incidental findings include left nephrolithiasis, left adrenal adenoma, and colonic diverticulosis.   Electronically Signed   By: Tiburcio PeaJonathan  Watts M.D.   On: 11/04/2014 09:30    ROS:  As stated above in the HPI otherwise negative.  Blood pressure 127/78, pulse 106, temperature 98.9 F (37.2 C), temperature source Oral, resp. rate 18, height 5\' 10"  (1.778 m), weight 117.482 kg (259 lb), SpO2 99 %.     PE: Gen: NAD, Alert and Oriented HEENT:  [place]/AT, EOMI Neck: Supple, no LAD Lungs: CTA Bilaterally CV: RRR without M/G/R ABM: Soft, NTND, +BS Ext: No C/C/E  Assessment/Plan: 1) Acute pancreatitis. 2) Marijuana abuse. 3) HCV   Upon admission he has not received adequate fluids.  He needs to have aggressive fluid hydration.  For the next 24-48 hours.  Upon my examination he appears well and he was able to tolerate apple juice without any pain.  His pancreatitis can be classified as mild at this time.  Additionally, I told him to avoid smoking marijuana.  It is a Therapist, musicBadalov Class 1  drug, i.e., a known culprit for pancreatitis with rechallenge.  I doubt that this is the cause, but I asked him to stop smoking.  I think his pancreatitis is from a gallstone pancreatitis given his clinical presentation, however, his ALT was not elevated.   Plan: 1) Increase fluids to 250-300 ml/hr. 2) Stop smoking marijuana. 3) Pain control. 4) Advance diet as tolerated. 5) If his fever persists after one week or his clinical status worsens, a repeat CT scan is warranted. 6) EUS as an outpatient in 2 months to evaluate for microlithiasis and to scan his pancreas. 7) I will work up his HCV further as an outpatient for potential treatment.  Quandra Fedorchak D 11/05/2014, 10:52 AM

## 2014-11-05 NOTE — Progress Notes (Signed)
On-call notified of pt temp. Of 100.8. No new orders, will pass along to day shift and continue to monitor.

## 2014-11-05 NOTE — Progress Notes (Signed)
Patient ID: Leroy Potter Silberstein, male   DOB: 1956-11-24, 58 y.o.   MRN: [id number] [facility] PROGRESS NOTE  Leroy Potter Shivley BJY:[id number] DOB: 1956-11-24 DOA: 11/04/2014 PCP: No primary care provider on file.  Brief narrative:   Assessment/Plan:    58 -year-old male with past medical history significant for hepatitis C but not on treatment, hypertension, anxiety who [facility] with worsening abdominal pain in epigastric and mid abdominal area for past 2 days prior to this admission. Patient reported pain to be 7 out of 10 in intensity, radiating to the back, constant, not relieved with home analgesia.  In ED, patient was hemodynamically stable. Blood work was significant for lipase level of 509 in addition to evidence of pancreatitis on CT abdomen pelvis without contrast. Patient was admitted for further management of acute pancreatitis.  Assessment & Plan    Principal Problem:  Acute pancreatitis - abdominal pain likely secondary to acute pancreatitis as evidenced on CT abdomen and pelvis without contrast. In addition, lipase level was elevated at 509. - Liver function enzymes are all within normal limits. - alcohol level is within normal limits and urine drug screen positive for opiates and THC. - Appreciate GI consult and recommendations. For now recommended increasing IV fluid rate to 250 cc/hr. - we will continue nothing by mouth for now, pain management as needed. Continue antiemetics as needed. Active Problems:  Leukocytosis - likely reflective of acute pancreatitis, inflammatory. Patient did have low-grade fever overnight, 100.8 F. - We will see if GI recommended city of antibiotics. For now recommended to just conservative management.  HTN (hypertension) - resume home medication Lotrel - renal function is within normal limits  Anxiety - since patient is nothing by mouth instead of Klonopin given Ativan Q 8 hours PRN  DVT prophylaxis:    SCD's bilaterally   Code Status: Full.  Family Communication:  plan of care discussed with the patient Disposition Plan: Home when stable.   IV access:   Peripheral IV  Procedures and diagnostic studies:    Ct Abdomen Pelvis Wo Contrast 11/04/2014   1. Pancreatitis without loculated collection. Recommend confirmation with serum pancreatic enzymes. 2. Bibasilar atelectasis with trace left pleural effusion. 3. Incidental findings include left nephrolithiasis, left adrenal adenoma, and colonic diverticulosis.    Medical Consultants:   Dr. Perley JainPatric Hung, Gastroenterology   Other Consultants:   None   IAnti-Infectives:    None    Manson PasseyEVINE, Kaycen Whitworth, MD  [facility] Pager [phone]  If 7PM-7AM, please contact night-coverage [email or web address] Password [email or web address] 11/05/2014, 10:53 AM   LOS: 1 day    HPI/Subjective: No acute overnight events.  Objective: Filed Vitals:   11/05/14 0541 11/05/14 0633 11/05/14 0931 11/05/14 0932  BP: 126/58  127/78   Pulse: 106     Temp: 100.8 F (38.2 C) 99.5 F (37.5 C)  98.9 F (37.2 C)  TempSrc: Oral Oral  Oral  Resp: 18     Height:      Weight: 117.482 kg (259 lb)     SpO2: 99%       Intake/Output Summary (Last 24 hours) at 11/05/14 1053 Last data filed at 11/05/14 0857  Gross per 24 hour  Intake   1000 ml  Output   1050 ml  Net    -50 ml    Exam:   General:  Pt is alert, follows commands appropriately, not in acute distress  Cardiovascular: Regular rate and rhythm, S1/S2, no murmurs  Respiratory: Clear to auscultation  bilaterally, no wheezing, no crackles, no rhonchi  Abdomen: Tender in epigastric area, non distended, bowel sounds present  Extremities: No edema, pulses DP and PT palpable bilaterally  Neuro: Grossly nonfocal  Data Reviewed: Basic Metabolic Panel:  Recent Labs Lab 11/04/14 0903 11/05/14 0500  NA 136* 138  K 3.8 4.2  CL 101 104  CO2 22 18*  GLUCOSE 133* 90  BUN 17 20  CREATININE 0.92 0.97   CALCIUM 8.8 8.4   Liver Function Tests:  Recent Labs Lab 11/04/14 0903 11/05/14 0500  AST 33 22  ALT 48 32  ALKPHOS 79 70  BILITOT 1.0 1.1  PROT 7.0 6.4  ALBUMIN 3.6 2.9*    Recent Labs Lab 11/04/14 0903  LIPASE 509*   No results for input(s): AMMONIA in the last 168 hours. CBC:  Recent Labs Lab 11/04/14 0903 11/05/14 0500  WBC 16.5* 17.4*  NEUTROABS 13.0*  --   HGB 15.4 14.9  HCT 44.9 45.1  MCV 90.9 94.2  PLT 164 148*   Cardiac Enzymes:  Recent Labs Lab 11/04/14 0903  TROPONINI <0.30   BNP: Invalid input(s): POCBNP CBG:  Recent Labs Lab 11/05/14 0737  GLUCAP 91    No results found for this or any previous visit (from the past 240 hour(s)).   Scheduled Meds: . amLODipine  10 mg Oral Daily   And  . benazepril  20 mg Oral Daily  . multivitamin with minerals  1 tablet Oral Daily   Continuous Infusions: . sodium chloride 75 mL/hr at 11/05/14 0018

## 2014-11-06 LAB — CBC
HCT: 44.6 % (ref 39.0–52.0)
HEMOGLOBIN: 15.1 g/dL (ref 13.0–17.0)
MCH: 31.3 pg (ref 26.0–34.0)
MCHC: 33.9 g/dL (ref 30.0–36.0)
MCV: 92.5 fL (ref 78.0–100.0)
PLATELETS: 159 10*3/uL (ref 150–400)
RBC: 4.82 MIL/uL (ref 4.22–5.81)
RDW: 13.1 % (ref 11.5–15.5)
WBC: 13.6 10*3/uL — ABNORMAL HIGH (ref 4.0–10.5)

## 2014-11-06 LAB — GLUCOSE, CAPILLARY: GLUCOSE-CAPILLARY: 100 mg/dL — AB (ref 70–99)

## 2014-11-06 LAB — LIPASE, BLOOD: LIPASE: 179 U/L — AB (ref 11–59)

## 2014-11-06 MED ORDER — LEVALBUTEROL HCL 0.63 MG/3ML IN NEBU
0.6300 mg | INHALATION_SOLUTION | Freq: Four times a day (QID) | RESPIRATORY_TRACT | Status: DC | PRN
  Administered 2014-11-06: 0.63 mg via RESPIRATORY_TRACT
  Filled 2014-11-06: qty 3

## 2014-11-06 MED ORDER — MORPHINE SULFATE 2 MG/ML IJ SOLN
2.0000 mg | INTRAMUSCULAR | Status: DC | PRN
  Administered 2014-11-06 – 2014-11-07 (×8): 2 mg via INTRAVENOUS
  Filled 2014-11-06 (×8): qty 1

## 2014-11-06 NOTE — Progress Notes (Signed)
Subjective: Difficult night with abdominal pain.  No exacerbation of pain with PO intake.  Feeling better at this time.  Objective: Vital signs in last 24 hours: Temp:  [98.3 F (36.8 C)-99.5 F (37.5 C)] 98.9 F (37.2 C) (11/14 0542) Pulse Rate:  [102-109] 102 (11/14 0542) Resp:  [18-20] 20 (11/14 0542) BP: (126-136)/(71-78) 129/75 mmHg (11/14 0542) SpO2:  [93 %-94 %] 93 % (11/14 0542) Weight:  [121.11 kg (267 lb)] 121.11 kg (267 lb) (11/14 0542) Last BM Date: 11/03/14  Intake/Output from previous day: 11/13 0701 - 11/14 0700 In: 1333 [I.V.:1333] Out: 550 [Urine:550] Intake/Output this shift: Total I/O In: 1333 [I.V.:1333] Out: -   General appearance: alert and no distress GI: mild epigastric tenderness  Lab Results:  Recent Labs  11/04/14 0903 11/05/14 0500  WBC 16.5* 17.4*  HGB 15.4 14.9  HCT 44.9 45.1  PLT 164 148*   BMET  Recent Labs  11/04/14 0903 11/05/14 0500  NA 136* 138  K 3.8 4.2  CL 101 104  CO2 22 18*  GLUCOSE 133* 90  BUN 17 20  CREATININE 0.92 0.97  CALCIUM 8.8 8.4   LFT  Recent Labs  11/05/14 0500  PROT 6.4  ALBUMIN 2.9*  AST 22  ALT 32  ALKPHOS 70  BILITOT 1.1   PT/INR No results for input(s): LABPROT, INR in the last 72 hours. Hepatitis Panel No results for input(s): HEPBSAG, HCVAB, HEPAIGM, HEPBIGM in the last 72 hours. C-Diff No results for input(s): CDIFFTOX in the last 72 hours. Fecal Lactopherrin No results for input(s): FECLLACTOFRN in the last 72 hours.  Studies/Results: Ct Abdomen Pelvis Wo Contrast  11/04/2014   CLINICAL DATA:  Abdominal pain and nausea  EXAM: CT ABDOMEN AND PELVIS WITHOUT CONTRAST  TECHNIQUE: Multidetector CT imaging of the abdomen and pelvis was performed following the standard protocol without IV contrast.  COMPARISON:  None.  FINDINGS: BODY WALL: Unremarkable.  LOWER CHEST: Bandlike opacities in the lower lungs consistent with atelectasis. Trace left pleural effusion.  ABDOMEN/PELVIS:  Liver:  Presumed 1 cm cyst in the upper central liver.  Biliary: No evidence of biliary obstruction or stone.  Pancreas: There is fat infiltration in the left upper quadrant, centered around the pancreas. The pancreatic parenchyma does not appear particularly expanded, but no other primary source of inflammation is identified. There is peritoneal fluid along the left paracolic gutter, without evidence of loculation. There is a sub cm area of relative low attenuation in the pancreatic body (image 29) not definitive for mass or cyst (volume averaging of fat could also give this appearance).  Spleen: Unremarkable.  Adrenals: 2 cm mass within the left adrenal gland, with density consistent with adenoma.  Kidneys and ureters: 4 mm stone in the lower pole left kidney, nonobstructive. No ureteral calculus or hydronephrosis.  Bladder: Unremarkable.  Reproductive: Unremarkable for age.  Bowel: No bowel obstruction or primary inflammation. Distal colonic diverticulosis. Remote appearing epiploic appendagitis along the sigmoid colon. Negative appendix  Retroperitoneum: Left upper quadrant retroperitoneal edema, as above.  Peritoneum: Small volume reactive ascites, accumulating in the left paracolic gutter and pelvis.  Vascular: No acute abnormality.  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 Slip and endplate spurs cause advanced bilateral foraminal stenosis.  IMPRESSION: 1. Pancreatitis without loculated collection. Recommend confirmation with serum pancreatic enzymes. 2. Bibasilar atelectasis with trace left pleural effusion. 3. Incidental findings include left nephrolithiasis, left adrenal adenoma, and colonic diverticulosis.   Electronically  Signed   By: Tiburcio PeaJonathan  Watts M.D.   On: 11/04/2014 09:30    Medications:  Scheduled: . amLODipine  10 mg Oral Daily   And  . benazepril  20 mg Oral Daily  . multivitamin with minerals  1 tablet Oral Daily    Continuous: . sodium chloride 300 mL/hr at 11/06/14 0413    Assessment/Plan: 1) Acute pancreatitis. 2) HCV.   Pain from pancreatitis last evening.  Good urination.  He is progressing well at this time and there is no fever.  Plan: 1) Change morphine frequency to Q2 hours. 2) Monitor for hypokalemia with the increased volume of IV fluids.   3) Continue with the current rate of IV fluids for now.  LOS: 2 days   Leroy Potter D 11/06/2014, 5:48 AM

## 2014-11-06 NOTE — Plan of Care (Signed)
Problem: Phase II Progression Outcomes Goal: Progress activity as tolerated unless otherwise ordered Outcome: Completed/Met Date Met:  11/06/14 Goal: Discharge plan established Outcome: Progressing Goal: Tolerates PO clear liquids Outcome: Completed/Met Date Met:  11/06/14 Goal: Blood sugar < 150 Outcome: Completed/Met Date Met:  11/06/14 Goal: Other Phase II Outcomes/Goals Outcome: Progressing

## 2014-11-06 NOTE — Progress Notes (Signed)
Patient ID: Leroy Potter, male   DOB: 10/21/1956, 58 y.o.   MRN: [id number] [facility] PROGRESS NOTE  Leroy FreeMalcolm W Holcomb JYN:[id number] DOB: 10/21/1956 DOA: 11/04/2014 PCP: No primary care provider on file.  Brief narrative: 58 -year-old male with past medical history significant for hepatitis C but not on treatment, hypertension, anxiety who [facility] with worsening abdominal pain in epigastric and mid abdominal area for past 2 days prior to this admission. Patient reported pain to be 7 out of 10 in intensity, radiating to the back, constant, not relieved with home analgesia.  In ED, patient was hemodynamically stable. Blood work was significant for lipase level of 509 in addition to evidence of pancreatitis on CT abdomen pelvis without contrast. Patient was admitted for further management of acute pancreatitis.  Assessment/Plan:    Principal Problem:  Acute pancreatitis - abdominal pain likely secondary to acute pancreatitis as evidenced on CT abdomen and pelvis without contrast. In addition, lipase level was elevated at 509. - Liver function enzymes are all within normal limits. - repeat lipase level this morning. - alcohol level is within normal limits and urine drug screen positive for opiates and THC. - GI has seen the pt in consultation. Recommended increasing the rate of IV fluids. Monitor potassium while on high rate fluids. - continue supportive care with analgesia and antiemetics PRN   Active Problems:  Leukocytosis - likely reflective of acute pancreatitis, inflammatory. Patient did have low-grade fever 11/05/2014, 100.8 F. - check CBC today   HTN (hypertension) - resume home medication Lotrel - renal function is within normal limits  Anxiety - since patient is nothing by mouth instead of Klonopin given Ativan Q 8 hours PRN  DVT prophylaxis:   SCD's bilaterally   Code Status: Full.  Family Communication: plan of care discussed  with the patient Disposition Plan: Home when stable.   IV access:   Peripheral IV  Procedures and diagnostic studies:   Ct Abdomen Pelvis Wo Contrast 11/04/2014 1. Pancreatitis without loculated collection. Recommend confirmation with serum pancreatic enzymes. 2. Bibasilar atelectasis with trace left pleural effusion. 3. Incidental findings include left nephrolithiasis, left adrenal adenoma, and colonic diverticulosis.   Medical Consultants:   Dr. Perley JainPatric Hung, Gastroenterology  Other Consultants:   None   IAnti-Infectives:    None   Manson PasseyEVINE, Keundra Petrucelli, MD  [facility] Pager [phone]  If 7PM-7AM, please contact night-coverage [email or web address] Password [email or web address] 11/06/2014, 11:39 AM   LOS: 2 days    HPI/Subjective: No acute overnight events.  Objective: Filed Vitals:   11/05/14 0932 11/05/14 1340 11/05/14 2104 11/06/14 0542  BP:  136/77 126/71 129/75  Pulse:  106 109 102  Temp: 98.9 F (37.2 C) 98.8 F (37.1 C) 98.3 F (36.8 C) 98.9 F (37.2 C)  TempSrc: Oral Oral Oral Oral  Resp:  18 20 20   Height:      Weight:    121.11 kg (267 lb)  SpO2:  94% 94% 93%    Intake/Output Summary (Last 24 hours) at 11/06/14 1139 Last data filed at 11/06/14 0900  Gross per 24 hour  Intake   4535 ml  Output   1125 ml  Net   3410 ml    Exam:   General:  Pt is alert, follows commands appropriately, not in acute distress  Cardiovascular: Regular rate and rhythm, S1/S2, no murmurs  Respiratory: Clear to auscultation bilaterally, no wheezing, no crackles, no rhonchi  Abdomen: tender in epigastric area, non distended, bowel sounds present  Extremities: pulses DP and PT palpable bilaterally   Data Reviewed: Basic Metabolic Panel:  Recent Labs Lab 11/04/14 0903 11/05/14 0500  NA 136* 138  K 3.8 4.2  CL 101 104  CO2 22 18*  GLUCOSE 133* 90  BUN 17 20  CREATININE 0.92 0.97  CALCIUM 8.8 8.4   Liver Function Tests:  Recent Labs Lab 11/04/14 0903  11/05/14 0500  AST 33 22  ALT 48 32  ALKPHOS 79 70  BILITOT 1.0 1.1  PROT 7.0 6.4  ALBUMIN 3.6 2.9*    Recent Labs Lab 11/04/14 0903  LIPASE 509*   No results for input(s): AMMONIA in the last 168 hours. CBC:  Recent Labs Lab 11/04/14 0903 11/05/14 0500  WBC 16.5* 17.4*  NEUTROABS 13.0*  --   HGB 15.4 14.9  HCT 44.9 45.1  MCV 90.9 94.2  PLT 164 148*   Cardiac Enzymes:  Recent Labs Lab 11/04/14 0903  TROPONINI <0.30   BNP: Invalid input(s): POCBNP CBG:  Recent Labs Lab 11/05/14 0737 11/06/14 0731  GLUCAP 91 100*    No results found for this or any previous visit (from the past 240 hour(s)).   Scheduled Meds: . amLODipine  10 mg Oral Daily   And  . benazepril  20 mg Oral Daily  . multivitamin with minerals  1 tablet Oral Daily   Continuous Infusions: . sodium chloride 300 mL/hr at 11/06/14 0702

## 2014-11-06 NOTE — Plan of Care (Signed)
Problem: Phase II Progression Outcomes Goal: Progress activity as tolerated unless otherwise ordered Outcome: Progressing Goal: Discharge plan established Outcome: Progressing Goal: Tolerates PO clear liquids Outcome: Progressing Goal: Nausea/vomiting controlled with medication Outcome: Completed/Met Date Met:  11/06/14 Goal: Blood sugar < 150 Outcome: Progressing Goal: Other Phase II Outcomes/Goals Outcome: Progressing

## 2014-11-07 LAB — BASIC METABOLIC PANEL
ANION GAP: 13 (ref 5–15)
BUN: 14 mg/dL (ref 6–23)
CO2: 19 meq/L (ref 19–32)
CREATININE: 0.95 mg/dL (ref 0.50–1.35)
Calcium: 8.2 mg/dL — ABNORMAL LOW (ref 8.4–10.5)
Chloride: 105 mEq/L (ref 96–112)
GFR calc non Af Amer: >90 mL/min (ref >90)
Glucose, Bld: 100 mg/dL — ABNORMAL HIGH (ref 70–99)
Potassium: 4 mEq/L (ref 3.7–5.3)
SODIUM: 137 meq/L (ref 137–147)

## 2014-11-07 LAB — GLUCOSE, CAPILLARY: Glucose-Capillary: 112 mg/dL — ABNORMAL HIGH (ref 70–99)

## 2014-11-07 MED ORDER — FUROSEMIDE 10 MG/ML IJ SOLN
20.0000 mg | Freq: Once | INTRAMUSCULAR | Status: AC
  Administered 2014-11-07: 20 mg via INTRAVENOUS
  Filled 2014-11-07: qty 2

## 2014-11-07 MED ORDER — OXYCODONE HCL 5 MG PO TABS: 5.0000 mg | ORAL_TABLET | ORAL | Status: DC | PRN

## 2014-11-07 MED ORDER — OXYCODONE HCL 5 MG PO TABS
5.0000 mg | ORAL_TABLET | ORAL | Status: DC | PRN
  Administered 2014-11-07 (×2): 10 mg via ORAL
  Filled 2014-11-07 (×2): qty 2

## 2014-11-07 MED ORDER — OXYCODONE-ACETAMINOPHEN 5-325 MG PO TABS
1.0000 | ORAL_TABLET | ORAL | Status: DC | PRN
  Filled 2014-11-07: qty 2

## 2014-11-07 NOTE — Progress Notes (Signed)
Patient discharged to home with family, discharge instructions reviewed with patient who verbalized understanding. New RX given to patient. 

## 2014-11-07 NOTE — Progress Notes (Signed)
Subjective: Feeling better.  He required the use of morphine 6 times yesterday.  Slept much better.  Objective: Vital signs in last 24 hours: Temp:  [98.1 F (36.7 C)-99.8 F (37.7 C)] 99.7 F (37.6 C) (11/15 0616) Pulse Rate:  [104-108] 108 (11/15 0616) Resp:  [18-20] 18 (11/15 0616) BP: (124-149)/(75-79) 131/79 mmHg (11/15 0616) SpO2:  [93 %-94 %] 93 % (11/15 0616) Last BM Date: 11/06/14  Intake/Output from previous day: 11/14 0701 - 11/15 0700 In: 7227 [P.O.:480; I.V.:6747] Out: 1250 [Urine:1250] Intake/Output this shift: Total I/O In: 710 [I.V.:710] Out: -   General appearance: alert and no distress GI: soft, non-tender; bowel sounds normal; no masses,  no organomegaly  Lab Results:  Recent Labs  11/04/14 0903 11/05/14 0500 11/06/14 1255  WBC 16.5* 17.4* 13.6*  HGB 15.4 14.9 15.1  HCT 44.9 45.1 44.6  PLT 164 148* 159   BMET  Recent Labs  11/04/14 0903 11/05/14 0500 11/07/14 0435  NA 136* 138 137  K 3.8 4.2 4.0  CL 101 104 105  CO2 22 18* 19  GLUCOSE 133* 90 100*  BUN 17 20 14   CREATININE 0.92 0.97 0.95  CALCIUM 8.8 8.4 8.2*   LFT  Recent Labs  11/05/14 0500  PROT 6.4  ALBUMIN 2.9*  AST 22  ALT 32  ALKPHOS 70  BILITOT 1.1   PT/INR No results for input(s): LABPROT, INR in the last 72 hours. Hepatitis Panel No results for input(s): HEPBSAG, HCVAB, HEPAIGM, HEPBIGM in the last 72 hours. C-Diff No results for input(s): CDIFFTOX in the last 72 hours. Fecal Lactopherrin No results for input(s): FECLLACTOFRN in the last 72 hours.  Studies/Results: No results found.  Medications:  Scheduled: . amLODipine  10 mg Oral Daily   And  . benazepril  20 mg Oral Daily  . multivitamin with minerals  1 tablet Oral Daily   Continuous: . sodium chloride 200 mL/hr at 11/06/14 1959    Assessment/Plan: 1) Acute pancreatitis. 2) HCV.   He continues to improve.  He desires to go home and I think it is reasonable to give him a try with oral pain  meds.  If his symptoms are controlled with oral pain meds and he does not have pain with PO intake, he can be discharged home.  I will have him follow up.  Plan: 1) Percocet. 2) D/C Morphine. 3) Advance to a full liquid diet.  He did not think he would have the ability to eat a regular meal.    LOS: 3 days   Berna Gitto D 11/07/2014, 6:20 AM

## 2014-11-07 NOTE — Discharge Summary (Signed)
Physician Discharge Summary  Almyra FreeMalcolm W Kist ION:[id number] DOB: 06/05/1956 DOA: 11/04/2014  PCP: No primary care provider on file.  Admit date: 11/04/2014 Discharge date: 11/07/2014  Recommendations for Outpatient Follow-up:  1. Diet as tolerated 2. Lipase level improved since admission from 500's to 100's 3. Please follow up with PCP in 1 week to make sure symptoms are stable. If you spike a fever after 1 week from this admission, please call PCP or return to ED for further evaluation of possible necrotic pancreatitis.  Discharge Diagnoses:  Principal Problem:   Acute pancreatitis Active Problems:   Leukocytosis   HTN (hypertension)   Anxiety    Discharge Condition: stable   Diet recommendation: as tolerated   History of present illness:  58 -year-old male with past medical history significant for hepatitis C but not on treatment, hypertension, anxiety who [facility]04/2014 with worsening abdominal pain in epigastric and mid abdominal area for past 2 days prior to this admission. Patient reported pain to be 7 out of 10 in intensity, radiating to the back, constant, not relieved with home analgesia.  In ED, patient was hemodynamically stable. Blood work was significant for lipase level of 509 in addition to evidence of pancreatitis on CT abdomen pelvis without contrast. Patient was admitted for further management of acute pancreatitis.  Assessment/Plan:    Principal Problem:  Acute pancreatitis - abdominal pain likely secondary to acute pancreatitis as evidenced on CT abdomen and pelvis without contrast. In addition, lipase level was elevated at 509. - Liver function enzymes are all within normal limits. - repeat lipase level - improving - alcohol level is within normal limits and urine drug screen positive for opiates and THC. - GI has seen the pt in consultation. Recommended increasing the rate of IV fluids. - patient subsequently felt better. tolerated  regular diet this morning and he insists on going home today.   Active Problems:   Shortness of breath - developed overnight due to high rate IV fluids - we gave dose of 20 mg IV lasix and he felt better; 5 day course of lasix 20  mg daily prescribed.   Leukocytosis - likely reflective of acute pancreatitis, inflammatory. Patient did have low-grade fever 11/05/2014, 100.8 F. - per GI if patient spikes a fever in 1 week form admission then repeat CT scan warranted otherwise will continue to monitor; pt aware of this recommendation.    HTN (hypertension) - resume home medication Lotrel - renal function is within normal limits  Anxiety - since patient is nothing by mouth instead of Klonopin given Ativan Q 8 hours PRN - on discharge he amy resume Klonopin   DVT prophylaxis:   SCD's bilaterally   Code Status: Full.  Family Communication: plan of care discussed with the patient   IV access:   Peripheral IV  Procedures and diagnostic studies:   Ct Abdomen Pelvis Wo Contrast 11/04/2014 1. Pancreatitis without loculated collection. Recommend confirmation with serum pancreatic enzymes. 2. Bibasilar atelectasis with trace left pleural effusion. 3. Incidental findings include left nephrolithiasis, left adrenal adenoma, and colonic diverticulosis.   Medical Consultants:   Dr. Perley JainPatric Hung, Gastroenterology  Other Consultants:   None  IAnti-Infectives:    None   Signed:  Manson PasseyEVINE, Hasten Sweitzer, MD  [facility] 11/07/2014, 2:19 PM  Pager #: [phone]   Discharge Exam: Filed Vitals:   11/07/14 1400  BP: 134/76  Pulse: 104  Temp: 98.5 F (36.9 C)  Resp: 18   Filed Vitals:  11/06/14 2155 11/07/14 0616 11/07/14 0646 11/07/14 1400  BP:  131/79  134/76  Pulse:  108  104  Temp:  99.7 F (37.6 C)  98.5 F (36.9 C)  TempSrc:  Oral  Oral  Resp:  18  18  Height:      Weight:   124.875 kg (275 lb 4.8 oz)   SpO2: 94% 93%  98%    General:  Pt is alert, follows commands appropriately, not in acute distress Cardiovascular: Regular rate and rhythm, S1/S2 +, no murmurs Respiratory: Clear to auscultation bilaterally, no wheezing, no crackles, no rhonchi Abdominal: Soft, non tender, non distended, bowel sounds +, no guarding Extremities: no edema, no cyanosis, pulses palpable bilaterally DP and PT Neuro: Grossly nonfocal  Discharge Instructions  Discharge Instructions    Call MD for:  difficulty breathing, headache or visual disturbances    Complete by:  As directed      Call MD for:  persistant dizziness or light-headedness    Complete by:  As directed      Call MD for:  persistant nausea and vomiting    Complete by:  As directed      Call MD for:  severe uncontrolled pain    Complete by:  As directed      Diet - low sodium heart healthy    Complete by:  As directed      Discharge instructions    Complete by:  As directed   1. Diet as tolerated 2. Lipase level improved since admission from 500's to 100's 3. Please follow up with PCP in 1 week to make sure symptoms are stable. If you spike a fever after 1 week from this admission, please call PCP or return to ED for further evaluation of possible necrotic pancreatitis.     Increase activity slowly    Complete by:  As directed             Medication List    TAKE these medications        amLODipine-benazepril 10-20 MG per capsule  Commonly known as:  LOTREL  Take 1 capsule by mouth daily.     clonazePAM 1 MG tablet  Commonly known as:  KLONOPIN  Take 0.5 mg by mouth daily as needed for anxiety.     cyclobenzaprine 10 MG tablet  Commonly known as:  FLEXERIL  Take 10 mg by mouth 2 (two) times daily as needed for muscle spasms.     multivitamin with minerals Tabs tablet  Take 1 tablet by mouth daily.     naproxen sodium 220 MG tablet  Commonly known as:  ANAPROX  Take 220 mg by mouth daily as needed (for pain).     oxyCODONE 5 MG immediate release tablet   Commonly known as:  Oxy IR/ROXICODONE  Take 1-2 tablets (5-10 mg total) by mouth every 3 (three) hours as needed for severe pain (acute pancreatitis pain).          The results of significant diagnostics from this hospitalization (including imaging, microbiology, ancillary and laboratory) are listed below for reference.    Significant Diagnostic Studies: Ct Abdomen Pelvis Wo Contrast  11/04/2014   CLINICAL DATA:  Abdominal pain and nausea  EXAM: CT ABDOMEN AND PELVIS WITHOUT CONTRAST  TECHNIQUE: Multidetector CT imaging of the abdomen and pelvis was performed following the standard protocol without IV contrast.  COMPARISON:  None.  FINDINGS: BODY WALL: Unremarkable.  LOWER CHEST: Bandlike opacities in the lower lungs consistent with atelectasis. Trace  left pleural effusion.  ABDOMEN/PELVIS:  Liver: Presumed 1 cm cyst in the upper central liver.  Biliary: No evidence of biliary obstruction or stone.  Pancreas: There is fat infiltration in the left upper quadrant, centered around the pancreas. The pancreatic parenchyma does not appear particularly expanded, but no other primary source of inflammation is identified. There is peritoneal fluid along the left paracolic gutter, without evidence of loculation. There is a sub cm area of relative low attenuation in the pancreatic body (image 29) not definitive for mass or cyst (volume averaging of fat could also give this appearance).  Spleen: Unremarkable.  Adrenals: 2 cm mass within the left adrenal gland, with density consistent with adenoma.  Kidneys and ureters: 4 mm stone in the lower pole left kidney, nonobstructive. No ureteral calculus or hydronephrosis.  Bladder: Unremarkable.  Reproductive: Unremarkable for age.  Bowel: No bowel obstruction or primary inflammation. Distal colonic diverticulosis. Remote appearing epiploic appendagitis along the sigmoid colon. Negative appendix  Retroperitoneum: Left upper quadrant retroperitoneal edema, as above.   Peritoneum: Small volume reactive ascites, accumulating in the left paracolic gutter and pelvis.  Vascular: No acute abnormality.  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 Slip and endplate spurs cause advanced bilateral foraminal stenosis.  IMPRESSION: 1. Pancreatitis without loculated collection. Recommend confirmation with serum pancreatic enzymes. 2. Bibasilar atelectasis with trace left pleural effusion. 3. Incidental findings include left nephrolithiasis, left adrenal adenoma, and colonic diverticulosis.   Electronically Signed   By: Tiburcio PeaJonathan  Watts M.D.   On: 11/04/2014 09:30    Microbiology: No results found for this or any previous visit (from the past 240 hour(s)).   Labs: Basic Metabolic Panel:  Recent Labs Lab 11/04/14 0903 11/05/14 0500 11/07/14 0435  NA 136* 138 137  K 3.8 4.2 4.0  CL 101 104 105  CO2 22 18* 19  GLUCOSE 133* 90 100*  BUN 17 20 14   CREATININE 0.92 0.97 0.95  CALCIUM 8.8 8.4 8.2*   Liver Function Tests:  Recent Labs Lab 11/04/14 0903 11/05/14 0500  AST 33 22  ALT 48 32  ALKPHOS 79 70  BILITOT 1.0 1.1  PROT 7.0 6.4  ALBUMIN 3.6 2.9*    Recent Labs Lab 11/04/14 0903 11/06/14 1255  LIPASE 509* 179*   No results for input(s): AMMONIA in the last 168 hours. CBC:  Recent Labs Lab 11/04/14 0903 11/05/14 0500 11/06/14 1255  WBC 16.5* 17.4* 13.6*  NEUTROABS 13.0*  --   --   HGB 15.4 14.9 15.1  HCT 44.9 45.1 44.6  MCV 90.9 94.2 92.5  PLT 164 148* 159   Cardiac Enzymes:  Recent Labs Lab 11/04/14 0903  TROPONINI <0.30   BNP: BNP (last 3 results) No results for input(s): PROBNP in the last 8760 hours. CBG:  Recent Labs Lab 11/05/14 0737 11/06/14 0731 11/07/14 0747  GLUCAP 91 100* 112*    Time coordinating discharge: Over 30 minutes

## 2014-11-07 NOTE — Discharge Instructions (Signed)

## 2014-11-12 ENCOUNTER — Other Ambulatory Visit: Payer: Self-pay | Admitting: Gastroenterology

## 2014-11-12 DIAGNOSIS — K851 Biliary acute pancreatitis without necrosis or infection: Secondary | ICD-10-CM

## 2014-11-12 DIAGNOSIS — R509 Fever, unspecified: Secondary | ICD-10-CM

## 2014-11-15 ENCOUNTER — Ambulatory Visit
Admission: RE | Admit: 2014-11-15 | Discharge: 2014-11-15 | Disposition: A | Discharge status: Home / Self Care | Payer: BC Managed Care – PPO | Source: Ambulatory Visit | Attending: Gastroenterology | Admitting: Gastroenterology

## 2014-11-15 DIAGNOSIS — R509 Fever, unspecified: Secondary | ICD-10-CM

## 2014-11-15 DIAGNOSIS — K851 Biliary acute pancreatitis without necrosis or infection: Secondary | ICD-10-CM

## 2014-11-15 MED ORDER — IOHEXOL 350 MG/ML SOLN
125.0000 mL | Freq: Once | INTRAVENOUS | Status: AC | PRN
  Administered 2014-11-15: 125 mL via INTRAVENOUS

## 2014-11-26 ENCOUNTER — Other Ambulatory Visit (HOSPITAL_COMMUNITY): Payer: Self-pay | Admitting: Gastroenterology

## 2014-11-26 DIAGNOSIS — R945 Abnormal results of liver function studies: Principal | ICD-10-CM

## 2014-11-26 DIAGNOSIS — R7989 Other specified abnormal findings of blood chemistry: Secondary | ICD-10-CM

## 2014-11-26 DIAGNOSIS — B1920 Unspecified viral hepatitis C without hepatic coma: Secondary | ICD-10-CM

## 2014-12-01 ENCOUNTER — Ambulatory Visit (HOSPITAL_COMMUNITY): Payer: BC Managed Care – PPO

## 2014-12-09 ENCOUNTER — Ambulatory Visit (HOSPITAL_COMMUNITY): Payer: Self-pay

## 2015-02-01 ENCOUNTER — Ambulatory Visit (HOSPITAL_COMMUNITY): Payer: Self-pay

## 2015-02-18 IMAGING — CT CT ABD-PEL WO/W CM
2 of 8 series · 13 of 46 positions shown, 18 images · IV contrast (WATER & [ID] OMNI 300)
Comparison: 11/04/2014.

CLINICAL DATA: Subsequent encounter for 2 week history
pancreatitis. Evaluate for pseudocyst versus pancreatic necrosis.

EXAM:
CT ABDOMEN AND PELVIS WITHOUT AND WITH CONTRAST
TECHNIQUE: Multidetector CT imaging of the abdomen and pelvis was performed
following the standard protocol before and following the bolus
administration of intravenous contrast.
CONTRAST:  125mL OMNIPAQUE IOHEXOL 350 MG/ML SOLN

[Series 4: port venous 2.5 · axial · portal-venous · 0.92mm/px · z∈[-471,-58]mm · 10 of 193 slices shown, 15 images]
[im 14/193  soft-tissue]
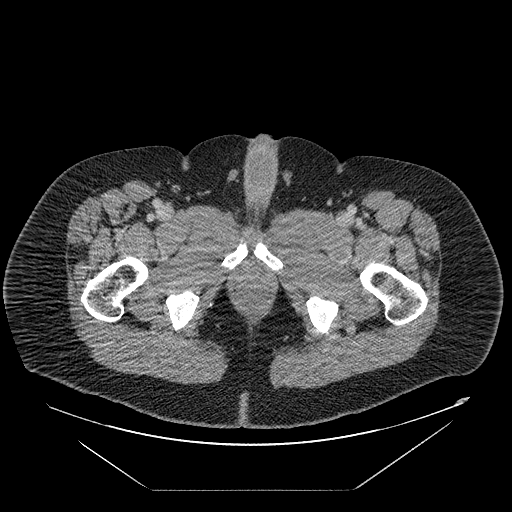
[im 14/193  bone]
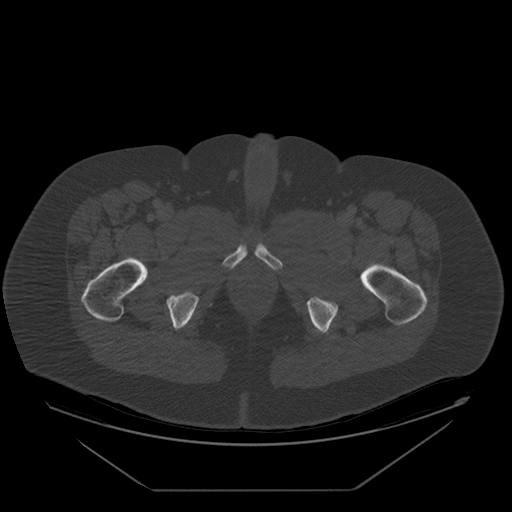
[im 42/193  soft-tissue]
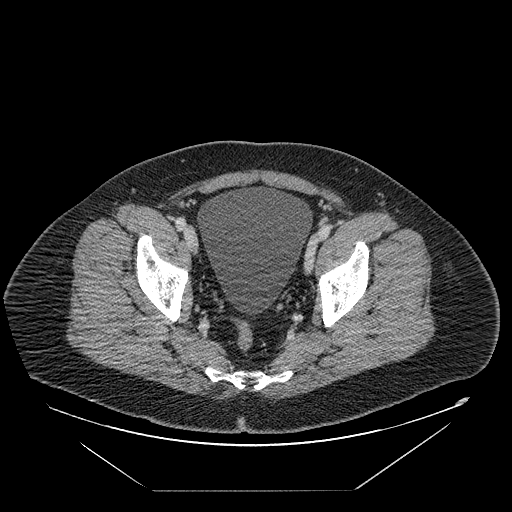
[im 55/193  soft-tissue]
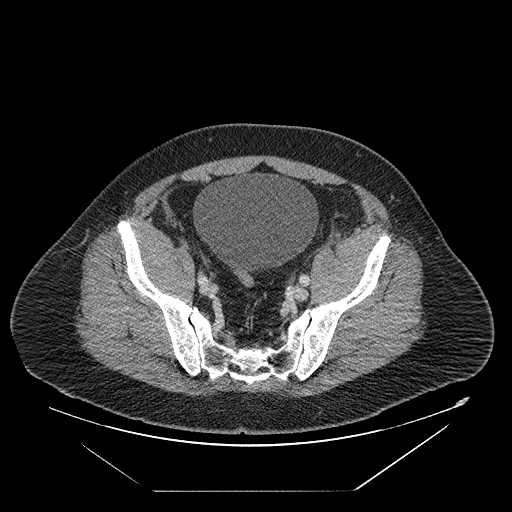
[im 83/193  soft-tissue]
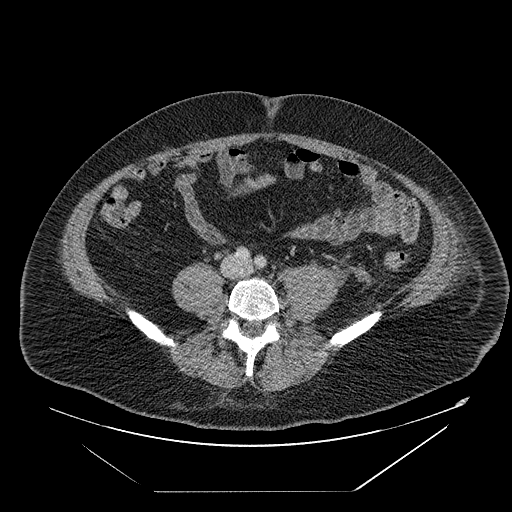
[im 97/193  soft-tissue]
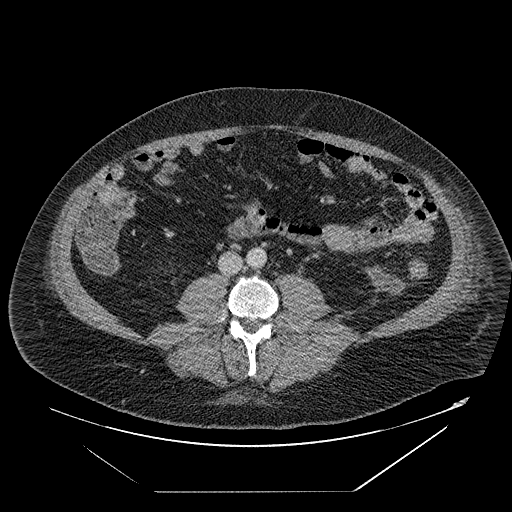
[im 110/193  soft-tissue]
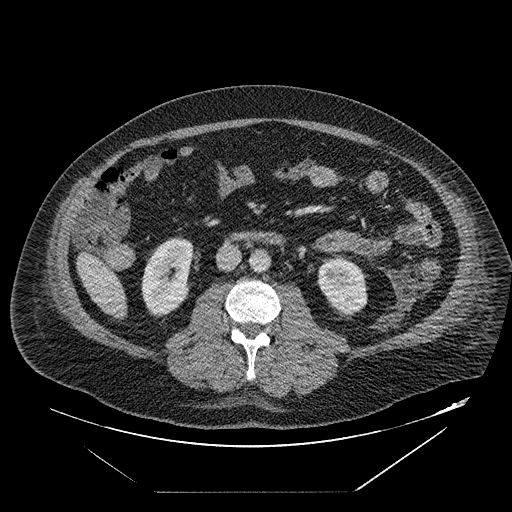
[im 138/193  soft-tissue]
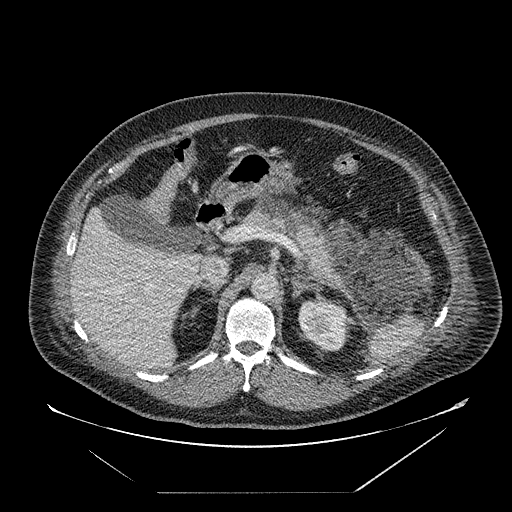
[im 138/193  lung]
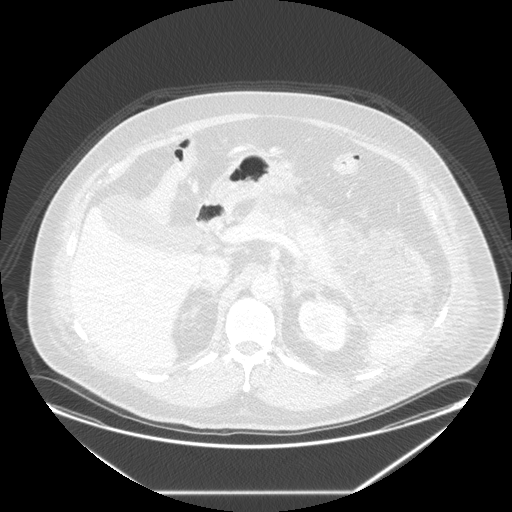
[im 151/193  soft-tissue]
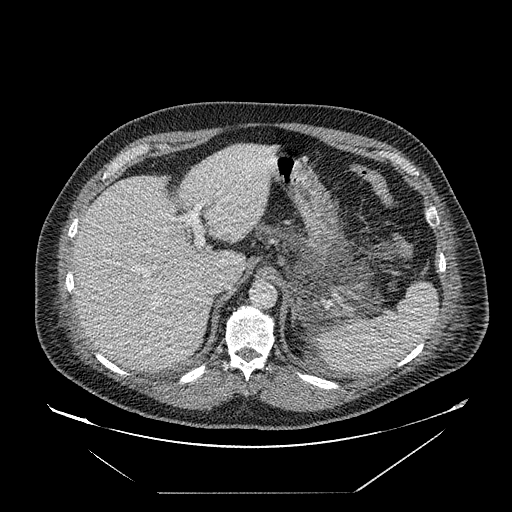
[im 151/193  lung]
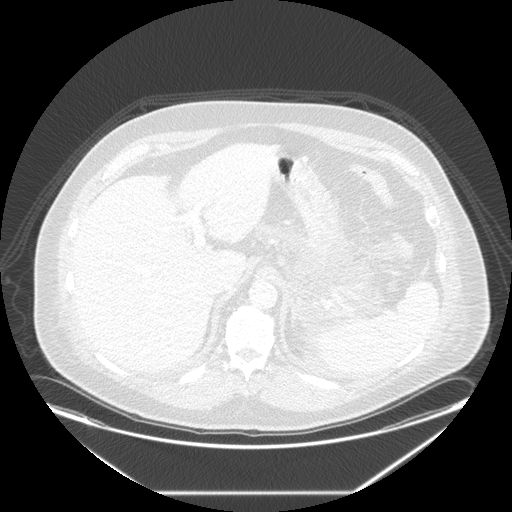
[im 165/193  lung]
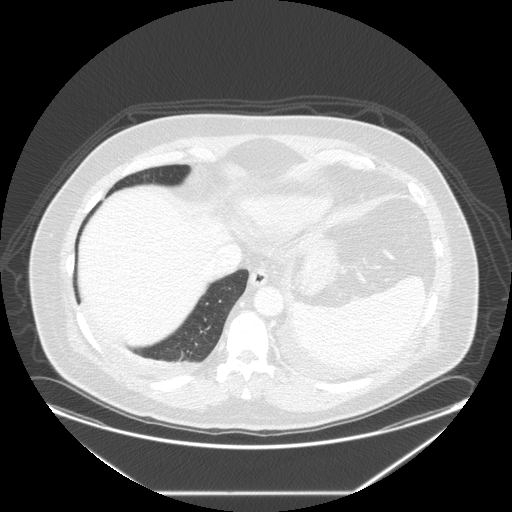
[im 179/193  soft-tissue]
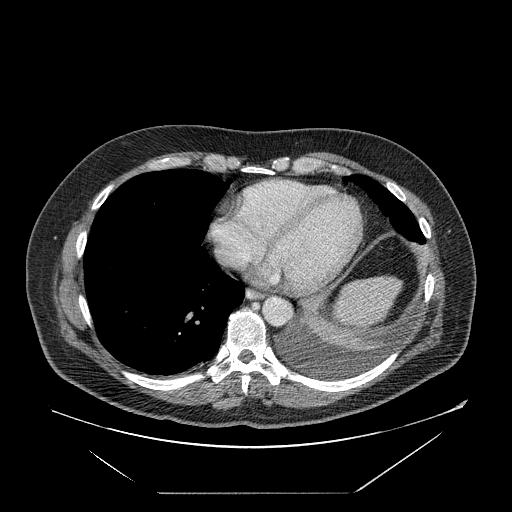
[im 179/193  lung]
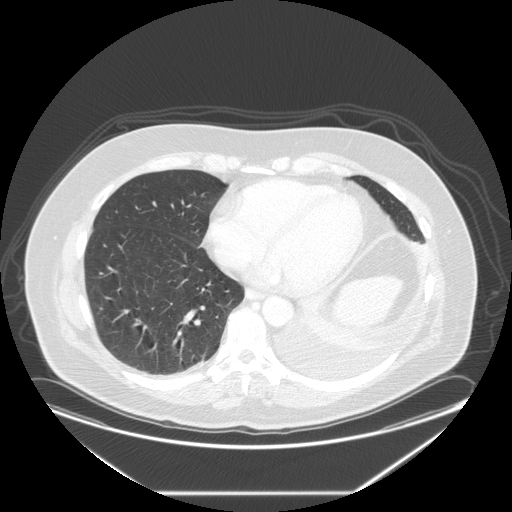
[im 179/193  bone]
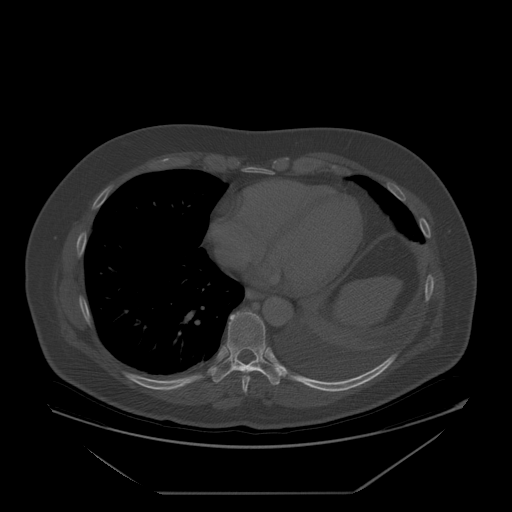

[Series 102: cor arterial · coronal · arterial · 0.95mm/px · 3 of 166 slices shown]
[im 42/166  soft-tissue]
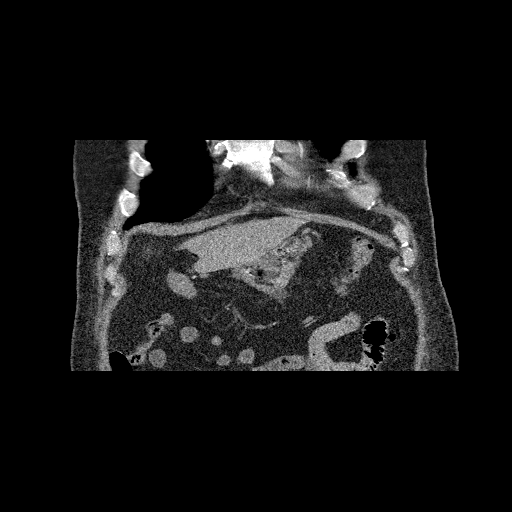
[im 83/166  soft-tissue]
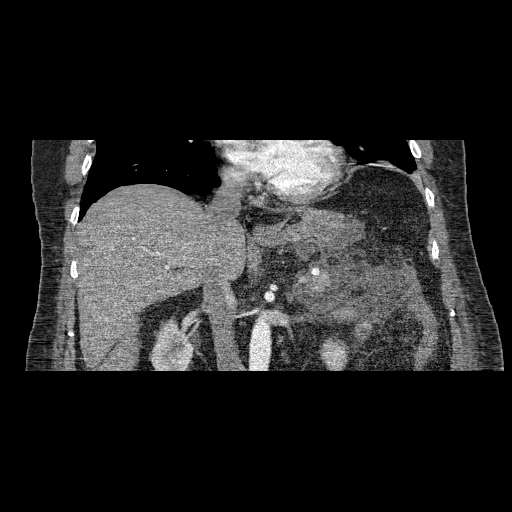
[im 124/166  soft-tissue]
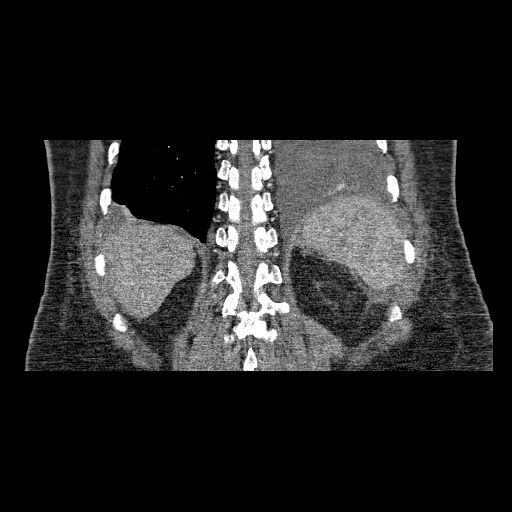

[13 of 46 positions shown; findings below may reference images not displayed]

FINDINGS: Lower chest: There is left lower lobe atelectasis with small left
pleural effusion and tiny right pleural effusion.

Hepatobiliary: 14 mm low-density lesion in the dome of the left
liver is stable and likely represents a cyst. Liver parenchyma is
otherwise unremarkable. There is no evidence for gallstones,
gallbladder wall thickening, or pericholecystic fluid. No
intrahepatic or extrahepatic biliary dilation.

Pancreas: The pancreatic head is normal in appearance. There is
peripancreatic edema in the body and tail region, most pronounced in
the region of the pancreatic tail. The edema tracks medially to the
posterior stomach and laterally into the splenic hilum. Pancreatic
parenchyma enhances throughout with no features of pancreatic
necrosis. There is no organized pseudocyst or abscess. Some of the
fluid tracks caudally into the left lateral Conal fascia. No
dilatation of the main pancreatic duct.

Spleen: There is some edema/inflammation in the splenic hilum
tracking in the inferior pole the spleen.

Adrenals/Urinary Tract: Right adrenal gland is normal. 18 mm left
adrenal nodule is stable. Average attenuation of this nodule is
approximately 0 Hounsfield units, compatible with benign adrenal
adenoma. Small cyst noted in the interpolar left kidney. Right
kidney is unremarkable. Bladder is distended but otherwise normal in
appearance.

Stomach/Bowel: Stomach is nondistended. No gastric wall thickening.
No evidence of outlet obstruction. Duodenum is normally positioned
as is the ligament of Treitz. No small bowel wall thickening. No
small bowel dilatation. Terminal ileum is normal. The appendix is
not visualized, but there is no edema or inflammation in the region
of the cecum. Diverticular changes are seen in the sigmoid colon
without diverticulitis.

Vascular/Lymphatic: No abdominal aortic aneurysm. There is no
lymphadenopathy in the abdomen. No pelvic sidewall lymphadenopathy.
The portal vein is patent. Superior mesenteric vein and splenic vein
are patent. Celiac axis is patent as is the SMA which demonstrates a
replaced right hepatic artery.

Reproductive: Prostate gland is unremarkable.

Other: No free fluid in the anatomic pelvis.

Musculoskeletal: Bilateral pars interarticularis defects are seen at
the level of L5.
IMPRESSION: Peripancreatic edema around the body and tail of the pancreas the
edema/ inflammation is slightly progressed in the interval without
evidence for pancreatic necrosis, pseudocyst, or abscess. No
evidence for vascular complication at this time.

## 2017-07-26 ENCOUNTER — Other Ambulatory Visit (HOSPITAL_COMMUNITY): Payer: Self-pay | Admitting: Gastroenterology

## 2017-07-26 DIAGNOSIS — B1920 Unspecified viral hepatitis C without hepatic coma: Secondary | ICD-10-CM

## 2017-08-06 ENCOUNTER — Ambulatory Visit (HOSPITAL_COMMUNITY): Payer: BLUE CROSS/BLUE SHIELD

## 2017-08-06 ENCOUNTER — Encounter (HOSPITAL_COMMUNITY): Payer: Self-pay

## 2017-12-09 DIAGNOSIS — G4700 Insomnia, unspecified: Secondary | ICD-10-CM | POA: Diagnosis not present

## 2017-12-09 DIAGNOSIS — E7800 Pure hypercholesterolemia, unspecified: Secondary | ICD-10-CM | POA: Diagnosis not present

## 2017-12-09 DIAGNOSIS — R7303 Prediabetes: Secondary | ICD-10-CM | POA: Diagnosis not present

## 2017-12-09 DIAGNOSIS — I10 Essential (primary) hypertension: Secondary | ICD-10-CM | POA: Diagnosis not present

## 2017-12-09 DIAGNOSIS — Z23 Encounter for immunization: Secondary | ICD-10-CM | POA: Diagnosis not present

## 2018-06-30 DIAGNOSIS — Z0000 Encounter for general adult medical examination without abnormal findings: Secondary | ICD-10-CM | POA: Diagnosis not present

## 2018-06-30 DIAGNOSIS — E7800 Pure hypercholesterolemia, unspecified: Secondary | ICD-10-CM | POA: Diagnosis not present

## 2018-06-30 DIAGNOSIS — G4700 Insomnia, unspecified: Secondary | ICD-10-CM | POA: Diagnosis not present

## 2018-06-30 DIAGNOSIS — R7303 Prediabetes: Secondary | ICD-10-CM | POA: Diagnosis not present

## 2018-06-30 DIAGNOSIS — Z125 Encounter for screening for malignant neoplasm of prostate: Secondary | ICD-10-CM | POA: Diagnosis not present

## 2018-06-30 DIAGNOSIS — Z23 Encounter for immunization: Secondary | ICD-10-CM | POA: Diagnosis not present

## 2018-06-30 DIAGNOSIS — D229 Melanocytic nevi, unspecified: Secondary | ICD-10-CM | POA: Diagnosis not present

## 2018-06-30 DIAGNOSIS — I10 Essential (primary) hypertension: Secondary | ICD-10-CM | POA: Diagnosis not present

## 2018-07-18 DIAGNOSIS — D2239 Melanocytic nevi of other parts of face: Secondary | ICD-10-CM | POA: Diagnosis not present

## 2018-07-18 DIAGNOSIS — D485 Neoplasm of uncertain behavior of skin: Secondary | ICD-10-CM | POA: Diagnosis not present

## 2018-10-20 DIAGNOSIS — Z23 Encounter for immunization: Secondary | ICD-10-CM | POA: Diagnosis not present

## 2019-01-08 DIAGNOSIS — G4700 Insomnia, unspecified: Secondary | ICD-10-CM | POA: Diagnosis not present

## 2019-01-08 DIAGNOSIS — E7800 Pure hypercholesterolemia, unspecified: Secondary | ICD-10-CM | POA: Diagnosis not present

## 2019-01-08 DIAGNOSIS — I10 Essential (primary) hypertension: Secondary | ICD-10-CM | POA: Diagnosis not present

## 2019-01-08 DIAGNOSIS — R7303 Prediabetes: Secondary | ICD-10-CM | POA: Diagnosis not present

## 2019-01-08 DIAGNOSIS — R6882 Decreased libido: Secondary | ICD-10-CM | POA: Diagnosis not present

## 2024-01-07 DIAGNOSIS — B1920 Unspecified viral hepatitis C without hepatic coma: Secondary | ICD-10-CM | POA: Diagnosis not present

## 2024-01-14 ENCOUNTER — Ambulatory Visit
Admission: RE | Admit: 2024-01-14 | Discharge: 2024-01-14 | Disposition: A | Discharge status: Home / Self Care | Payer: PPO | Source: Ambulatory Visit | Attending: Gastroenterology | Admitting: Gastroenterology

## 2024-01-14 DIAGNOSIS — B1920 Unspecified viral hepatitis C without hepatic coma: Secondary | ICD-10-CM

## 2024-03-23 ENCOUNTER — Other Ambulatory Visit: Payer: Self-pay

## 2024-03-23 ENCOUNTER — Emergency Department (HOSPITAL_COMMUNITY)
Admission: EM | Admit: 2024-03-23 | Discharge: 2024-03-23 | Disposition: A | Discharge status: Home / Self Care | Attending: Emergency Medicine | Admitting: Emergency Medicine

## 2024-03-23 ENCOUNTER — Emergency Department (HOSPITAL_COMMUNITY)

## 2024-03-23 ENCOUNTER — Encounter (HOSPITAL_COMMUNITY): Payer: Self-pay | Admitting: Emergency Medicine

## 2024-03-23 DIAGNOSIS — R6884 Jaw pain: Secondary | ICD-10-CM | POA: Insufficient documentation

## 2024-03-23 DIAGNOSIS — Z79899 Other long term (current) drug therapy: Secondary | ICD-10-CM | POA: Diagnosis not present

## 2024-03-23 DIAGNOSIS — R072 Precordial pain: Secondary | ICD-10-CM | POA: Diagnosis not present

## 2024-03-23 DIAGNOSIS — R600 Localized edema: Secondary | ICD-10-CM | POA: Diagnosis not present

## 2024-03-23 DIAGNOSIS — I10 Essential (primary) hypertension: Secondary | ICD-10-CM | POA: Diagnosis not present

## 2024-03-23 DIAGNOSIS — D72829 Elevated white blood cell count, unspecified: Secondary | ICD-10-CM | POA: Diagnosis not present

## 2024-03-23 DIAGNOSIS — R0789 Other chest pain: Secondary | ICD-10-CM | POA: Diagnosis present

## 2024-03-23 DIAGNOSIS — R079 Chest pain, unspecified: Secondary | ICD-10-CM | POA: Diagnosis not present

## 2024-03-23 LAB — CBC
HCT: 45.8 % (ref 39.0–52.0)
Hemoglobin: 15 g/dL (ref 13.0–17.0)
MCH: 30.4 pg (ref 26.0–34.0)
MCHC: 32.8 g/dL (ref 30.0–36.0)
MCV: 92.9 fL (ref 80.0–100.0)
Platelets: 171 10*3/uL (ref 150–400)
RBC: 4.93 MIL/uL (ref 4.22–5.81)
RDW: 13.1 % (ref 11.5–15.5)
WBC: 11.8 10*3/uL — ABNORMAL HIGH (ref 4.0–10.5)
nRBC: 0 % (ref 0.0–0.2)

## 2024-03-23 LAB — COMPREHENSIVE METABOLIC PANEL WITH GFR
ALT: 24 U/L (ref 0–44)
AST: 29 U/L (ref 15–41)
Albumin: 4.3 g/dL (ref 3.5–5.0)
Alkaline Phosphatase: 55 U/L (ref 38–126)
Anion gap: 9 (ref 5–15)
BUN: 19 mg/dL (ref 8–23)
CO2: 24 mmol/L (ref 22–32)
Calcium: 9 mg/dL (ref 8.9–10.3)
Chloride: 107 mmol/L (ref 98–111)
Creatinine, Ser: 1.25 mg/dL — ABNORMAL HIGH (ref 0.61–1.24)
GFR, Estimated: >60 mL/min (ref >60)
Glucose, Bld: 109 mg/dL — ABNORMAL HIGH (ref 70–99)
Potassium: 4.1 mmol/L (ref 3.5–5.1)
Sodium: 140 mmol/L (ref 135–145)
Total Bilirubin: 0.8 mg/dL (ref 0.0–1.2)
Total Protein: 7 g/dL (ref 6.5–8.1)

## 2024-03-23 LAB — D-DIMER, QUANTITATIVE: D-Dimer, Quant: <0.27 ug{FEU}/mL (ref 0.00–0.50)

## 2024-03-23 LAB — TROPONIN I (HIGH SENSITIVITY)
Troponin I (High Sensitivity): 17 ng/L (ref <18)
Troponin I (High Sensitivity): 13 ng/L (ref <18)

## 2024-03-23 MED ORDER — NITROGLYCERIN 0.4 MG SL SUBL
0.4000 mg | SUBLINGUAL_TABLET | SUBLINGUAL | Status: DC | PRN
  Administered 2024-03-23: 0.4 mg via SUBLINGUAL
  Filled 2024-03-23: qty 1

## 2024-03-23 NOTE — ED Provider Notes (Signed)
  EMERGENCY DEPARTMENT AT [facility] Provider Note   CSN: [id number] Arrival date & time: 03/23/24  0149     History  Chief Complaint  Patient presents with   Chest Pain    Leroy Potter is a 68 y.o. male.  The history is provided by the patient.  Chest Pain Leroy Potter is a 68 y.o. male who presents to the Emergency Department complaining of chest pain.  He presents to the emergency department for evaluation of chest pain.  Around 730 he developed some pain in the right side of his jaw and when he went to bed he developed tightness across his chest, which is worse when he lays down.  He does report coughing more than baseline.  He thought he might have some allergies and took a Benadryl.  He also took 650 of aspirin.  He feels like he needs to cough when he takes a deep breath and does have some pain on breathing.  No fever, abdominal pain, leg swelling or pain.  He does have a history of hypertension.  His father has a history of coronary artery disease in his 71s.  No tobacco.  Currently taking Epclusa for hep C since feb 8.        Home Medications Prior to Admission medications   Medication Sig Start Date End Date Taking? Authorizing Provider  amLODipine-benazepril (LOTREL) 10-20 MG per capsule Take 1 capsule by mouth daily.    [provider]  clonazePAM (KLONOPIN) 1 MG tablet Take 0.5 mg by mouth daily as needed for anxiety.    [provider]  cyclobenzaprine (FLEXERIL) 10 MG tablet Take 10 mg by mouth 2 (two) times daily as needed for muscle spasms.    [provider]  Multiple Vitamin (MULTIVITAMIN WITH MINERALS) TABS tablet Take 1 tablet by mouth daily.    [provider]  naproxen sodium (ANAPROX) 220 MG tablet Take 220 mg by mouth daily as needed (for pain).    [provider]  oxyCODONE (OXY IR/ROXICODONE) 5 MG immediate release tablet Take 1-2 tablets (5-10 mg total) by mouth every 3 (three)  hours as needed for severe pain (acute pancreatitis pain). 11/07/14   Alison Murray, MD      Allergies    Patient has no known allergies.    Review of Systems   Review of Systems  Cardiovascular:  Positive for chest pain.  All other systems reviewed and are negative.   Physical Exam Updated Vital Signs BP 133/82   Pulse 82   Temp 98.6 F (37 C) (Oral)   Resp 16   Wt 125 kg   SpO2 95%   BMI 39.54 kg/m  Physical Exam Vitals and nursing note reviewed.  Constitutional:      Appearance: He is well-developed.  HENT:     Head: Normocephalic and atraumatic.  Cardiovascular:     Rate and Rhythm: Normal rate and regular rhythm.     Heart sounds: No murmur heard. Pulmonary:     Effort: Pulmonary effort is normal. No respiratory distress.     Breath sounds: Normal breath sounds.  Abdominal:     Palpations: Abdomen is soft.     Tenderness: There is no abdominal tenderness. There is no guarding or rebound.  Musculoskeletal:        General: No tenderness.     Comments: Trace edema to BLE  Skin:    General: Skin is warm and dry.  Neurological:  Mental Status: He is alert and oriented to person, place, and time.  Psychiatric:        Behavior: Behavior normal.     ED Results / Procedures / Treatments   Labs (all labs ordered are listed, but only abnormal results are displayed) Labs Reviewed  CBC - Abnormal; Notable for the following components:      Result Value   WBC 11.8 (*)    All other components within normal limits  COMPREHENSIVE METABOLIC PANEL WITH GFR - Abnormal; Notable for the following components:   Glucose, Bld 109 (*)    Creatinine, Ser 1.25 (*)    All other components within normal limits  D-DIMER, QUANTITATIVE  TROPONIN I (HIGH SENSITIVITY)  TROPONIN I (HIGH SENSITIVITY)    EKG EKG Interpretation Date/Time:  Monday March 23 2024 01:57:06 EDT Ventricular Rate:  73 PR Interval:  162 QRS Duration:  93 QT Interval:  387 QTC Calculation: 427 R  Axis:   9  Text Interpretation: Sinus rhythm Low voltage, precordial leads Abnormal R-wave progression, early transition Confirmed by Tilden Fossa [phone]) on 03/23/2024 2:00:43 AM  Radiology DG Chest Port 1 View Result Date: 03/23/2024 CLINICAL DATA:  Chest pain EXAM: PORTABLE CHEST 1 VIEW COMPARISON:  None Available. FINDINGS: Normal cardiomediastinal silhouette. No focal consolidation, pleural effusion, or pneumothorax. No displaced rib fractures. IMPRESSION: No acute cardiopulmonary disease. Electronically Signed   By: Minerva Fester M.D.   On: 03/23/2024 02:25    Procedures Procedures    Medications Ordered in ED Medications  nitroGLYCERIN (NITROSTAT) SL tablet 0.4 mg (0.4 mg Sublingual Given 03/23/24 0500)    ED Course/ Medical Decision Making/ A&P                                 Medical Decision Making Amount and/or Complexity of Data Reviewed Labs: ordered. Radiology: ordered.  Risk Prescription drug management.   Patient here for evaluation of right-sided jaw pain, chest discomfort.  EKG is nonischemic and troponins are negative x 2.  He does not have any risk factors for DVT/PE.  D-dimer is negative.  Current picture is not consistent with PE.  Chest x-ray is negative for acute abnormality.  CBC with mild leukocytosis, no evidence of acute infectious process at this time.  No recent available for comparison, but on prior labs and 2015 he also had leukocytosis.  CMP without significant abnormality.  Discussed with patient unclear source of symptoms, pain is resolved on repeat assessment.  Current picture is not consistent with ACS, PE, dissection, pneumonia, acute CHF.  Offered patient admission for further cardiac evaluation and he declines.  Will place referral for cardiology evaluation as an outpatient.  Discussed close return precautions if he has progressive or new concerning symptoms.        Final Clinical Impression(s) / ED Diagnoses Final diagnoses:   Precordial pain    Rx / DC Orders ED Discharge Orders          Ordered    Ambulatory referral to Cardiology        03/23/24 0610              Tilden Fossa, MD 03/23/24 [id number]

## 2024-03-23 NOTE — ED Triage Notes (Signed)
 Patient c/o chest pain that started last night.  Patient states pain started in right jaw and started radiating down into his chest.  Patient describes pain as pressure.

## 2024-04-03 ENCOUNTER — Ambulatory Visit: Attending: Cardiology | Admitting: Cardiology

## 2024-04-03 ENCOUNTER — Encounter: Payer: Self-pay | Admitting: Cardiology

## 2024-04-03 VITALS — BP 116/80 | HR 74 | Ht 70.0 in | Wt 257.4 lb

## 2024-04-03 DIAGNOSIS — R072 Precordial pain: Secondary | ICD-10-CM

## 2024-04-03 DIAGNOSIS — E785 Hyperlipidemia, unspecified: Secondary | ICD-10-CM | POA: Diagnosis not present

## 2024-04-03 DIAGNOSIS — R0683 Snoring: Secondary | ICD-10-CM | POA: Diagnosis not present

## 2024-04-03 DIAGNOSIS — R0609 Other forms of dyspnea: Secondary | ICD-10-CM | POA: Diagnosis not present

## 2024-04-03 DIAGNOSIS — R0789 Other chest pain: Secondary | ICD-10-CM | POA: Diagnosis not present

## 2024-04-03 DIAGNOSIS — Z87891 Personal history of nicotine dependence: Secondary | ICD-10-CM | POA: Diagnosis not present

## 2024-04-03 DIAGNOSIS — I10 Essential (primary) hypertension: Secondary | ICD-10-CM | POA: Diagnosis not present

## 2024-04-03 MED ORDER — ASPIRIN 81 MG PO TBEC: 81.0000 mg | DELAYED_RELEASE_TABLET | Freq: Every day | ORAL | Status: AC

## 2024-04-03 MED ORDER — METOPROLOL TARTRATE 100 MG PO TABS
ORAL_TABLET | ORAL | 0 refills | Status: AC
Start: 2024-04-03 — End: ?

## 2024-04-03 MED ORDER — NITROGLYCERIN 0.4 MG SL SUBL: 0.4000 mg | SUBLINGUAL_TABLET | SUBLINGUAL | 6 refills | Status: AC | PRN

## 2024-04-03 NOTE — Progress Notes (Signed)
 Cardiology Consultation:    Date:  04/03/2024   ID:  Leroy Potter, DOB 27-Dec-1955, MRN [id number]  PCP:  Leroy Joe, MD  Cardiologist:  Leroy Balsam, MD   Referring MD: Leroy Joe, MD   Chief Complaint  Patient presents with   Hospitalization Follow-up         History of Present Illness:    Leroy Potter is a 68 y.o. male who is being seen today for the evaluation of chest pain at the request of Leroy Joe, MD. 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  Comes today to my office after visiting emergency room.  2 weekends ago he was working outside he was cutting grass for something unusual he does not do much physical activities except some traveling.  After all day working he was horribly exhausted tired and up sleeping quite well next day however he feels weak and he woke up in the middle of the night with heaviness tightness in the chest with some shortness of breath no sweating no radiation waited few minutes eventually and then going to the emergency room pain was worse when he was laying down pain this was worse with taking deep breath.  D-dimer troponins negative he was discharged home EKG did not show any acute changes since that time he took an easy and doing quite okay right now.  Denies have any more chest pain like this he never had it before he never happened after.  He does have family history of premature coronary artery disease, he i had ultiple events first 10 around 68 years old however he lived May more than 80.  He is not on any special diet he does not exercise on the regular basis.  He is a Scientist, product/process development.  Past Medical History:  Diagnosis Date   Hypertension     Past Surgical History:  Procedure Laterality Date   SHOULDER SURGERY      Current  Medications: Current Meds  Medication Sig   amLODipine-benazepril (LOTREL) 10-20 MG per capsule Take 1 capsule by mouth daily.   clonazePAM (KLONOPIN) 1 MG tablet Take 0.5 mg by mouth daily as needed for anxiety.   EPCLUSA 400-100 MG TABS Take 1 tablet by mouth as directed.   naproxen sodium (ANAPROX) 220 MG tablet Take 220 mg by mouth daily as needed (for pain).   Turmeric (QC TUMERIC COMPLEX PO) Take 1 tablet by mouth daily.   [DISCONTINUED] cyclobenzaprine (FLEXERIL) 10 MG tablet Take 10 mg by mouth 2 (two) times daily as needed for muscle spasms.   [DISCONTINUED] Multiple Vitamin (MULTIVITAMIN WITH MINERALS) TABS tablet Take 1 tablet by mouth daily.   [DISCONTINUED] oxyCODONE (OXY IR/ROXICODONE) 5 MG immediate release tablet Take 1-2 tablets (5-10 mg total) by mouth every 3 (three) hours as needed for severe pain (acute pancreatitis pain).     Allergies:   Patient has no known allergies.   Social History   Socioeconomic History   Marital status: Married    Spouse name: Not on file   Number of children: Not on file   Years of education: Not on file   Highest education level: Not on file  Occupational History   Not on file  Tobacco Use   Smoking status: Never   Smokeless tobacco: Not on file  Substance and Sexual Activity   Alcohol use:  Yes   Drug use: Not on file   Sexual activity: Not on file  Other Topics Concern   Not on file  Social History Narrative   ** Merged History Encounter **       Social Drivers of Health   Financial Resource Strain: Not on file  Food Insecurity: Not on file  Transportation Needs: Not on file  Physical Activity: Not on file  Stress: Not on file  Social Connections: Not on file     Family History: The patient's family history is not on file. ROS:   Please see the history of present illness.    All 14 point review of systems negative except as described per history of present illness.  EKGs/Labs/Other Studies Reviewed:    The  following studies were reviewed today:   EKG:  EKG Interpretation Date/Time:  Friday April 03 2024 08:52:06 EDT Ventricular Rate:  79 PR Interval:  158 QRS Duration:  78 QT Interval:  360 QTC Calculation: 412 R Axis:   13  Text Interpretation: Normal sinus rhythm Low voltage QRS Borderline ECG When compared with ECG of 23-Mar-2024 01:57, PREVIOUS ECG IS PRESENT Confirmed by Leroy Potter [phone]) on 04/03/2024 8:57:56 AM    Recent Labs: 03/23/2024: ALT 24; BUN 19; Creatinine, Ser 1.25; Hemoglobin 15.0; Platelets 171; Potassium 4.1; Sodium 140  Recent Lipid Panel    Component Value Date/Time   TRIG 54 11/04/2014 0903    Physical Exam:    VS:  BP 116/80 (BP Location: Left Arm, Patient Position: Sitting)   Pulse 74   Ht 5\' 10"  (1.778 m)   Wt 257 lb 6.4 oz (116.8 kg)   SpO2 95%   BMI 36.93 kg/m     Wt Readings from Last 3 Encounters:  04/03/24 257 lb 6.4 oz (116.8 kg)  03/23/24 275 lb 9.2 oz (125 kg)  11/07/14 275 lb 4.8 oz (124.9 kg)     GEN:  Well nourished, well developed in no acute distress HEENT: Normal NECK: No JVD; No carotid bruits LYMPHATICS: No lymphadenopathy CARDIAC: RRR, no murmurs, no rubs, no gallops RESPIRATORY:  Clear to auscultation without rales, wheezing or rhonchi  ABDOMEN: Soft, non-tender, non-distended MUSCULOSKELETAL:  No edema; No deformity  SKIN: Warm and dry NEUROLOGIC:  Alert and oriented x 3 PSYCHIATRIC:  Normal affect   ASSESSMENT:    1. Primary hypertension   2. Atypical chest pain   3. Dyslipidemia   4. Ex-smoker    PLAN:    In order of problems listed above:  Atypical chest pain.  He does have significant risk factor for coronary artery disease, history of smoking mild dyslipidemia family history of premature coronary artery disease, hypertension, sedentary lifestyle.  I think we must evaluate him for presence of active coronary artery disease.  Will schedule him to have coronary CT angio.  I asked him to start taking 1 baby  aspirin every single day I gave him prescription for nitroglycerin. Dyspnea exertion fatigue tiredness will do echocardiogram to assess left ventricle ejection fraction. His wife is present during our visit and she is participating in decision-making.  She described the fact he snores and sometimes stop breathing at night as strongly suspect sleep apnea.  Will schedule him to have sleep study When I see him. Dyslipidemia LDL 105 HDL 53.  Will wait for results of the test to decide about treatment.   Medication Adjustments/Labs and Tests Ordered: Current medicines are reviewed at length with the patient today.  Concerns regarding medicines are  outlined above.  Orders Placed This Encounter  Procedures   EKG 12-Lead   No orders of the defined types were placed in this encounter.   Signed, Georgeanna Lea, MD, [facility]. 04/03/2024 9:38 AM    [facility]

## 2024-04-03 NOTE — Patient Instructions (Addendum)
 Medication Instructions:   START: Aspirin 81mg  1 tablet daily  START: Nitroglycerin -Use nitroglycerin 1 tablet placed under the tongue at the first sign of chest pain or an angina attack. 1 tablet may be used every 5 minutes as needed, for up to 15 minutes. Do not take more than 3 tablets in 15 minutes. If pain persist call 911 or go to the nearest ED.    TAKE: Metoprolol 100mg  1 tablet 2 hours prior to CT Scan   Lab Work: None Ordered If you have labs (blood work) drawn today and your tests are completely normal, you will receive your results only by: MyChart Message (if you have MyChart) OR A paper copy in the mail If you have any lab test that is abnormal or we need to change your treatment, we will call you to review the results.   Testing/Procedures:  Your physician has requested that you have an echocardiogram. Echocardiography is a painless test that uses sound waves to create images of your heart. It provides your doctor with information about the size and shape of your heart and how well your heart's chambers and valves are working. This procedure takes approximately one hour. There are no restrictions for this procedure. Please do NOT wear cologne, perfume, aftershave, or lotions (deodorant is allowed). Please arrive 15 minutes prior to your appointment time.  Please note: We ask at that you not bring children with you during ultrasound (echo/ vascular) testing. Due to room size and safety concerns, children are not allowed in the ultrasound rooms during exams. Our front office staff cannot provide observation of children in our lobby area while testing is being conducted. An adult accompanying a patient to their appointment will only be allowed in the ultrasound room at the discretion of the ultrasound technician under special circumstances. We apologize for any inconvenience.   Your cardiac CT will be scheduled at one of the below locations:   [facility] [address] [place], [place] [address]  Please follow these instructions carefully (unless otherwise directed):  Hold all erectile dysfunction medications at least 3 days (72 hrs) prior to test.  On the Night Before the Test: Be sure to Drink plenty of water. Do not consume any caffeinated/decaffeinated beverages or chocolate 12 hours prior to your test. Do not take any antihistamines 12 hours prior to your test.   On the Day of the Test: Drink plenty of water until 1 hour prior to the test. Do not eat any food 4 hours prior to the test. You may take your regular medications prior to the test.  Take metoprolol (Lopressor) two hours prior to test.          After the Test: Drink plenty of water. After receiving IV contrast, you may experience a mild flushed feeling. This is normal. On occasion, you may experience a mild rash up to 24 hours after the test. This is not dangerous. If this occurs, you can take Benadryl 25 mg and increase your fluid intake. If you experience trouble breathing, this can be serious. If it is severe call 911 IMMEDIATELY. If it is mild, please call our office. If you take any of these medications: Glipizide/Metformin, Avandament, Glucavance, please do not take 48 hours after completing test unless otherwise instructed.  We will call to schedule your test 2-4 weeks out understanding that some insurance companies will need an authorization prior to the service being performed.   For non-scheduling related questions, please contact the  cardiac imaging nurse navigator should you have any questions/concerns: Rockwell Alexandria, Cardiac Imaging Nurse Navigator Larey Brick, Cardiac Imaging Nurse Navigator [facility] Direct Office Dial: [phone]   For scheduling needs, including cancellations and rescheduling, please call [place], [phone].   [facility] Sleep Study- They will call when insurance authorizes and you can begin  test   Follow-Up: At [facility], you and your health needs are our priority.  As part of our continuing mission to provide you with exceptional heart care, we have created designated Provider Care Teams.  These Care Teams include your primary Cardiologist (physician) and Advanced Practice Providers (APPs -  Physician Assistants and Nurse Practitioners) who all work together to provide you with the care you need, when you need it.  We recommend signing up for the patient portal called "MyChart".  Sign up information is provided on this After Visit Summary.  MyChart is used to connect with patients for Virtual Visits (Telemedicine).  Patients are able to view lab/test results, encounter notes, upcoming appointments, etc.  Non-urgent messages can be sent to your provider as well.   To learn more about what you can do with MyChart, go to [email or web address].    Your next appointment:   2 month(s)  The format for your next appointment:   In Person  Provider:   Gypsy Balsam, MD    Other Instructions NA

## 2024-04-24 ENCOUNTER — Encounter (HOSPITAL_COMMUNITY): Payer: Self-pay

## 2024-04-28 ENCOUNTER — Other Ambulatory Visit (HOSPITAL_COMMUNITY)

## 2024-04-28 ENCOUNTER — Ambulatory Visit (HOSPITAL_BASED_OUTPATIENT_CLINIC_OR_DEPARTMENT_OTHER)
Admission: RE | Admit: 2024-04-28 | Discharge: 2024-04-28 | Disposition: A | Discharge status: Home / Self Care | Payer: Self-pay | Source: Ambulatory Visit | Attending: Cardiology | Admitting: Cardiology

## 2024-04-28 ENCOUNTER — Ambulatory Visit (HOSPITAL_COMMUNITY)
Admission: RE | Admit: 2024-04-28 | Discharge: 2024-04-28 | Disposition: A | Discharge status: Home / Self Care | Source: Ambulatory Visit | Attending: Cardiology | Admitting: Cardiology

## 2024-04-28 ENCOUNTER — Other Ambulatory Visit: Payer: Self-pay | Admitting: Cardiology

## 2024-04-28 ENCOUNTER — Other Ambulatory Visit (HOSPITAL_BASED_OUTPATIENT_CLINIC_OR_DEPARTMENT_OTHER)

## 2024-04-28 DIAGNOSIS — I2510 Atherosclerotic heart disease of native coronary artery without angina pectoris: Secondary | ICD-10-CM

## 2024-04-28 DIAGNOSIS — R931 Abnormal findings on diagnostic imaging of heart and coronary circulation: Secondary | ICD-10-CM

## 2024-04-28 DIAGNOSIS — R072 Precordial pain: Secondary | ICD-10-CM

## 2024-04-28 MED ORDER — IOHEXOL 350 MG/ML SOLN
100.0000 mL | Freq: Once | INTRAVENOUS | Status: AC | PRN
  Administered 2024-04-28: 100 mL via INTRAVENOUS

## 2024-04-28 MED ORDER — NITROGLYCERIN 0.4 MG SL SUBL
SUBLINGUAL_TABLET | SUBLINGUAL | Status: AC
  Filled 2024-04-28: qty 2

## 2024-04-28 MED ORDER — NITROGLYCERIN 0.4 MG SL SUBL
0.8000 mg | SUBLINGUAL_TABLET | Freq: Once | SUBLINGUAL | Status: AC
  Administered 2024-04-28: 0.8 mg via SUBLINGUAL

## 2024-04-30 ENCOUNTER — Ambulatory Visit: Attending: Cardiology

## 2024-04-30 DIAGNOSIS — R0609 Other forms of dyspnea: Secondary | ICD-10-CM

## 2024-04-30 LAB — ECHOCARDIOGRAM COMPLETE
Area-P 1/2: 3.06 cm2
S' Lateral: 3.5 cm

## 2024-05-05 ENCOUNTER — Ambulatory Visit: Payer: Self-pay

## 2024-05-12 DIAGNOSIS — Z6838 Body mass index (BMI) 38.0-38.9, adult: Secondary | ICD-10-CM | POA: Diagnosis not present

## 2024-05-12 DIAGNOSIS — M1712 Unilateral primary osteoarthritis, left knee: Secondary | ICD-10-CM | POA: Diagnosis not present

## 2024-05-12 DIAGNOSIS — E7800 Pure hypercholesterolemia, unspecified: Secondary | ICD-10-CM | POA: Diagnosis not present

## 2024-05-12 DIAGNOSIS — R7303 Prediabetes: Secondary | ICD-10-CM | POA: Diagnosis not present

## 2024-05-12 DIAGNOSIS — G4700 Insomnia, unspecified: Secondary | ICD-10-CM | POA: Diagnosis not present

## 2024-05-12 DIAGNOSIS — I10 Essential (primary) hypertension: Secondary | ICD-10-CM | POA: Diagnosis not present

## 2024-05-12 DIAGNOSIS — M62830 Muscle spasm of back: Secondary | ICD-10-CM | POA: Diagnosis not present

## 2024-05-12 DIAGNOSIS — B1920 Unspecified viral hepatitis C without hepatic coma: Secondary | ICD-10-CM | POA: Diagnosis not present

## 2024-05-14 ENCOUNTER — Telehealth: Payer: Self-pay

## 2024-05-14 NOTE — Telephone Encounter (Signed)
 Left message on My Chart with CT Angio Over read results per Dr. Tonja Fray note. Routed to PCP.

## 2024-05-15 ENCOUNTER — Telehealth: Payer: Self-pay

## 2024-05-15 NOTE — Telephone Encounter (Signed)
 Left message on My Chart with CT Angio results per Dr. Tonja Fray note. Routed to PCP.

## 2024-06-08 ENCOUNTER — Ambulatory Visit: Attending: Cardiology

## 2024-06-08 ENCOUNTER — Ambulatory Visit: Attending: Cardiology | Admitting: Cardiology

## 2024-06-08 ENCOUNTER — Encounter: Payer: Self-pay | Admitting: Cardiology

## 2024-06-08 VITALS — BP 112/68 | HR 74 | Resp 16 | Ht 71.0 in | Wt 257.4 lb

## 2024-06-08 DIAGNOSIS — I2510 Atherosclerotic heart disease of native coronary artery without angina pectoris: Secondary | ICD-10-CM | POA: Insufficient documentation

## 2024-06-08 DIAGNOSIS — E785 Hyperlipidemia, unspecified: Secondary | ICD-10-CM | POA: Diagnosis not present

## 2024-06-08 DIAGNOSIS — R002 Palpitations: Secondary | ICD-10-CM

## 2024-06-08 DIAGNOSIS — Z87891 Personal history of nicotine dependence: Secondary | ICD-10-CM

## 2024-06-08 DIAGNOSIS — I10 Essential (primary) hypertension: Secondary | ICD-10-CM

## 2024-06-08 MED ORDER — ROSUVASTATIN CALCIUM 10 MG PO TABS: 10.0000 mg | ORAL_TABLET | Freq: Every day | ORAL | 3 refills | Status: AC

## 2024-06-08 NOTE — Patient Instructions (Signed)
 Medication Instructions:  Your physician has recommended you make the following change in your medication:   Start Rosuvastatin 10 mg daily  *If you need a refill on your cardiac medications before your next appointment, please call your pharmacy*   Lab Work: Your physician recommends that you return for lab work in: 6 weeks for fasting lipids, AST and ALT. You need to have labs done when you are fasting.  You can come Monday through Friday 8:30 am to 12:00 pm and 1:15 to 4:30. You do not need to make an appointment as the order has already been placed. The labs you are going to have done are BMET, CBC, TSH, LFT and Lipids.  If you have labs (blood work) drawn today and your tests are completely normal, you will receive your results only by: MyChart Message (if you have MyChart) OR A paper copy in the mail If you have any lab test that is abnormal or we need to change your treatment, we will call you to review the results.   Testing/Procedures: A zio monitor was ordered today. It will remain on for 14 days. Remove 06/22/24. You will then return monitor and event diary in provided box. It takes 1-2 weeks for report to be downloaded and returned to us . We will call you with the results. If monitor falls off or has orange flashing light, please call Zio for further instructions.    Follow-Up: At [facility], you and your health needs are our priority.  As part of our continuing mission to provide you with exceptional heart care, we have created designated Provider Care Teams.  These Care Teams include your primary Cardiologist (physician) and Advanced Practice Providers (APPs -  Physician Assistants and Nurse Practitioners) who all work together to provide you with the care you need, when you need it.  We recommend signing up for the patient portal called MyChart.  Sign up information is provided on this After Visit Summary.  MyChart is used to connect with patients for Virtual  Visits (Telemedicine).  Patients are able to view lab/test results, encounter notes, upcoming appointments, etc.  Non-urgent messages can be sent to your provider as well.   To learn more about what you can do with MyChart, go to [email or web address].    Your next appointment:   6 month(s)  The format for your next appointment:   In Person  Provider:   Ralene Burger, MD    Other Instructions none  Important Information About Sugar

## 2024-06-08 NOTE — Addendum Note (Signed)
 Addended by: Einar Grave on: 06/08/2024 01:19 PM   Modules accepted: Orders

## 2024-06-08 NOTE — Progress Notes (Signed)
 Cardiology Office Note:    Date:  06/08/2024   ID:  Leroy Potter, DOB Jun 03, 1956, MRN [id number]  PCP:  Rae Bugler, MD  Cardiologist:  Ralene Burger, MD    Referring MD: Rae Bugler, MD   No chief complaint on file.   History of Present Illness:    Leroy Potter is a 68 y.o. male past medical history significant for dyslipidemia, ex-smoker, essential hypertension was sent to us  because of symptoms of fatigue tiredness shortness of breath as well as some atypical chest pain.  Evaluation so far included echocardiogram showed preserved left ventricle ejection fraction, coronary CT angio revealed moderate disease but FFR has been negative.  His calcium score wa 437 which is 79% low, total plaque volume 584 mm which is 58% he comes today to talk about his he just came back from [place] in [place] he does have Apple Watch he did have some have alarm from his Apple Watch about atrial fibrillation.  He never had atrial fibrillation before, he did not feel any palpitations when it happened.  Otherwise doing good denies have any chest pain tightness squeezing pressure burning chest  Past Medical History:  Diagnosis Date   Hypertension     Past Surgical History:  Procedure Laterality Date   SHOULDER SURGERY      Current Medications: Current Meds  Medication Sig   amLODipine -benazepril  (LOTREL) 10-20 MG per capsule Take 1 capsule by mouth daily.   aspirin  EC 81 MG tablet Take 1 tablet (81 mg total) by mouth daily. Swallow whole.   clonazePAM (KLONOPIN) 1 MG tablet Take 0.5 mg by mouth daily as needed for anxiety.   naproxen sodium (ANAPROX) 220 MG tablet Take 220 mg by mouth daily as needed (for pain).   nitroGLYCERIN  (NITROSTAT ) 0.4 MG SL tablet Place 1 tablet (0.4 mg total) under the tongue every 5 (five) minutes as needed for chest pain.   Turmeric (QC TUMERIC COMPLEX PO) Take 1 tablet by mouth daily.     Allergies:   Patient has no known allergies.   Social History    Socioeconomic History   Marital status: Married    Spouse name: Not on file   Number of children: Not on file   Years of education: Not on file   Highest education level: Not on file  Occupational History   Not on file  Tobacco Use   Smoking status: Never   Smokeless tobacco: Not on file  Substance and Sexual Activity   Alcohol use: Yes   Drug use: Not on file   Sexual activity: Not on file  Other Topics Concern   Not on file  Social History Narrative   ** Merged History Encounter **       Social Drivers of Health   Financial Resource Strain: Not on file  Food Insecurity: Not on file  Transportation Needs: Not on file  Physical Activity: Not on file  Stress: Not on file  Social Connections: Not on file     Family History: The patient's family history is not on file. ROS:   Please see the history of present illness.    All 14 point review of systems negative except as described per history of present illness  EKGs/Labs/Other Studies Reviewed:         Recent Labs: 03/23/2024: ALT 24; BUN 19; Creatinine, Ser 1.25; Hemoglobin 15.0; Platelets 171; Potassium 4.1; Sodium 140  Recent Lipid Panel    Component Value Date/Time   TRIG 54 11/04/2014 0903  Physical Exam:    VS:  BP 112/68 (BP Location: Left Arm, Patient Position: Sitting, Cuff Size: Normal)   Pulse 74   Resp 16   Ht 5' 11 (1.803 m)   Wt 257 lb 6.4 oz (116.8 kg)   SpO2 93%   BMI 35.90 kg/m     Wt Readings from Last 3 Encounters:  06/08/24 257 lb 6.4 oz (116.8 kg)  04/03/24 257 lb 6.4 oz (116.8 kg)  03/23/24 275 lb 9.2 oz (125 kg)     GEN:  Well nourished, well developed in no acute distress HEENT: Normal NECK: No JVD; No carotid bruits LYMPHATICS: No lymphadenopathy CARDIAC: RRR, no murmurs, no rubs, no gallops RESPIRATORY:  Clear to auscultation without rales, wheezing or rhonchi  ABDOMEN: Soft, non-tender, non-distended MUSCULOSKELETAL:  No edema; No deformity  SKIN: Warm and  dry LOWER EXTREMITIES: no swelling NEUROLOGIC:  Alert and oriented x 3 PSYCHIATRIC:  Normal affect   ASSESSMENT:    1. Primary hypertension   2. Coronary artery disease involving native coronary artery of native heart without angina pectoris   3. Ex-smoker   4. Dyslipidemia    PLAN:    In order of problems listed above:  Coronary disease.  Nonobstructive based on testing that we have done on top of that he does not have any more symptoms.  Will continue antiplatelet therapy in form of aspirin , will initiate statin. Dyslipidemia his LDL was 105.  Will start Crestor 10 mg daily, fasting lipid profile, AST 36 weeks. Ex-smoker encouraged him to stay away from smoking. Questionable atrial fibrillation as noted by Apple Watch.  Will put Zio patch on him. Sleep study he does have equipment but there is still waiting for approval from insurance company.   Medication Adjustments/Labs and Tests Ordered: Current medicines are reviewed at length with the patient today.  Concerns regarding medicines are outlined above.  No orders of the defined types were placed in this encounter.  Medication changes: No orders of the defined types were placed in this encounter.   Signed, Manfred Seed, MD, [facility] 06/08/2024 12:55 PM    [facility]

## 2024-06-23 DIAGNOSIS — Z1211 Encounter for screening for malignant neoplasm of colon: Secondary | ICD-10-CM | POA: Diagnosis not present

## 2024-06-23 DIAGNOSIS — B1920 Unspecified viral hepatitis C without hepatic coma: Secondary | ICD-10-CM | POA: Diagnosis not present

## 2024-06-30 DIAGNOSIS — R002 Palpitations: Secondary | ICD-10-CM | POA: Diagnosis not present

## 2024-07-30 ENCOUNTER — Encounter: Payer: Self-pay | Admitting: Cardiology

## 2024-08-04 ENCOUNTER — Ambulatory Visit: Payer: Self-pay | Admitting: Cardiology

## 2024-08-04 DIAGNOSIS — R002 Palpitations: Secondary | ICD-10-CM

## 2024-08-06 ENCOUNTER — Telehealth: Payer: Self-pay

## 2024-08-06 NOTE — Telephone Encounter (Signed)
 Left message on My Chart with monitor results per Dr. Karry note. Routed to PCP

## 2024-08-11 ENCOUNTER — Telehealth: Payer: Self-pay

## 2024-08-11 NOTE — Telephone Encounter (Signed)
 Pt viewed monitor results on My Chart per Dr. Vanetta Shawl note. Routed to PCP.

## 2024-11-30 DIAGNOSIS — M62830 Muscle spasm of back: Secondary | ICD-10-CM | POA: Diagnosis not present

## 2024-11-30 DIAGNOSIS — Z6839 Body mass index (BMI) 39.0-39.9, adult: Secondary | ICD-10-CM | POA: Diagnosis not present

## 2024-11-30 DIAGNOSIS — E7800 Pure hypercholesterolemia, unspecified: Secondary | ICD-10-CM | POA: Diagnosis not present

## 2024-11-30 DIAGNOSIS — Z23 Encounter for immunization: Secondary | ICD-10-CM | POA: Diagnosis not present

## 2024-11-30 DIAGNOSIS — Z125 Encounter for screening for malignant neoplasm of prostate: Secondary | ICD-10-CM | POA: Diagnosis not present

## 2024-11-30 DIAGNOSIS — Z0000 Encounter for general adult medical examination without abnormal findings: Secondary | ICD-10-CM | POA: Diagnosis not present

## 2024-11-30 DIAGNOSIS — M1712 Unilateral primary osteoarthritis, left knee: Secondary | ICD-10-CM | POA: Diagnosis not present

## 2024-11-30 DIAGNOSIS — Z8619 Personal history of other infectious and parasitic diseases: Secondary | ICD-10-CM | POA: Diagnosis not present

## 2024-11-30 DIAGNOSIS — G4700 Insomnia, unspecified: Secondary | ICD-10-CM | POA: Diagnosis not present

## 2024-11-30 DIAGNOSIS — R7303 Prediabetes: Secondary | ICD-10-CM | POA: Diagnosis not present

## 2024-11-30 DIAGNOSIS — I10 Essential (primary) hypertension: Secondary | ICD-10-CM | POA: Diagnosis not present

## 2024-12-04 ENCOUNTER — Telehealth: Payer: Self-pay

## 2024-12-04 NOTE — Telephone Encounter (Signed)
**Note De-Identified Gershon Shorten Obfuscation** I started a [facility]-HST PA through the [employer] Portal and it is currently pending review.  Outpatient Authorization [id number]

## 2024-12-08 NOTE — Telephone Encounter (Signed)
 [facility] study approved -valid dates 12/04/2024--03-04-2025.  Ordering provider: DR BERNIE Associated diagnoses: R06.83 WatchPAT PA obtained on 12/09/2024 by Joshua Dalton Seip, CMA. Authorization: No; tracking ID AUTH# [id number] Patient notified of PIN (1234) on 12/09/2024 via Notification Method: phone.  Reached out to patient and lmtcb about his HST.

## 2024-12-09 NOTE — Telephone Encounter (Signed)
 Left message for pt to call back to schedule a time to pick up his HST.

## 2024-12-11 NOTE — Telephone Encounter (Signed)
 Left message to call back to schedule a pick up time.

## 2024-12-14 ENCOUNTER — Telehealth: Payer: Self-pay

## 2024-12-23 NOTE — Telephone Encounter (Signed)
 Reached out to the patient concerning his Leroy Potter and patient has declined to take the test.
